# Patient Record
Sex: Male | Born: 1985 | Race: Black or African American | Hispanic: No | Marital: Married | Smoking: Current every day smoker
Health system: Southern US, Community
[De-identification: ages and names within clinical notes are randomized; demographics above are authoritative.]

---
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
## 2003-08-02 ENCOUNTER — Emergency Department (HOSPITAL_COMMUNITY): Admission: EM | Admit: 2003-08-02 | Discharge: 2003-08-02 | Payer: Self-pay | Admitting: Emergency Medicine

## 2003-08-15 ENCOUNTER — Emergency Department (HOSPITAL_COMMUNITY): Admission: EM | Admit: 2003-08-15 | Discharge: 2003-08-15 | Payer: Self-pay | Admitting: Emergency Medicine

## 2003-09-12 ENCOUNTER — Emergency Department (HOSPITAL_COMMUNITY): Admission: EM | Admit: 2003-09-12 | Discharge: 2003-09-12 | Payer: Self-pay | Admitting: Emergency Medicine

## 2005-10-28 ENCOUNTER — Emergency Department (HOSPITAL_COMMUNITY): Admission: EM | Admit: 2005-10-28 | Discharge: 2005-10-28 | Payer: Self-pay | Admitting: Emergency Medicine

## 2005-12-13 ENCOUNTER — Emergency Department (HOSPITAL_COMMUNITY): Admission: EM | Admit: 2005-12-13 | Discharge: 2005-12-13 | Payer: Self-pay | Admitting: Emergency Medicine

## 2005-12-15 ENCOUNTER — Emergency Department (HOSPITAL_COMMUNITY): Admission: EM | Admit: 2005-12-15 | Discharge: 2005-12-15 | Payer: Self-pay | Admitting: Emergency Medicine

## 2005-12-17 ENCOUNTER — Emergency Department (HOSPITAL_COMMUNITY): Admission: EM | Admit: 2005-12-17 | Discharge: 2005-12-17 | Payer: Self-pay | Admitting: Emergency Medicine

## 2006-01-15 ENCOUNTER — Emergency Department (HOSPITAL_COMMUNITY): Admission: EM | Admit: 2006-01-15 | Discharge: 2006-01-15 | Payer: Self-pay | Admitting: Emergency Medicine

## 2006-10-21 ENCOUNTER — Emergency Department (HOSPITAL_COMMUNITY): Admission: EM | Admit: 2006-10-21 | Discharge: 2006-10-21 | Payer: Self-pay | Admitting: Family Medicine

## 2007-01-09 ENCOUNTER — Emergency Department (HOSPITAL_COMMUNITY): Admission: EM | Admit: 2007-01-09 | Discharge: 2007-01-09 | Payer: Self-pay | Admitting: Emergency Medicine

## 2007-09-30 ENCOUNTER — Emergency Department (HOSPITAL_COMMUNITY): Admission: EM | Admit: 2007-09-30 | Discharge: 2007-09-30 | Payer: Self-pay | Admitting: Emergency Medicine

## 2011-03-25 LAB — WOUND CULTURE

## 2012-02-06 ENCOUNTER — Emergency Department (HOSPITAL_COMMUNITY)
Admission: EM | Admit: 2012-02-06 | Discharge: 2012-02-06 | Disposition: A | Discharge status: Home / Self Care | Payer: Self-pay | Attending: Emergency Medicine | Admitting: Emergency Medicine

## 2012-02-06 ENCOUNTER — Emergency Department (HOSPITAL_COMMUNITY): Payer: Self-pay

## 2012-02-06 ENCOUNTER — Encounter (HOSPITAL_COMMUNITY): Payer: Self-pay | Admitting: Adult Health

## 2012-02-06 DIAGNOSIS — F17200 Nicotine dependence, unspecified, uncomplicated: Secondary | ICD-10-CM | POA: Insufficient documentation

## 2012-02-06 DIAGNOSIS — S61219A Laceration without foreign body of unspecified finger without damage to nail, initial encounter: Secondary | ICD-10-CM

## 2012-02-06 DIAGNOSIS — S61209A Unspecified open wound of unspecified finger without damage to nail, initial encounter: Secondary | ICD-10-CM | POA: Insufficient documentation

## 2012-02-06 DIAGNOSIS — Y998 Other external cause status: Secondary | ICD-10-CM | POA: Insufficient documentation

## 2012-02-06 DIAGNOSIS — W268XXA Contact with other sharp object(s), not elsewhere classified, initial encounter: Secondary | ICD-10-CM | POA: Insufficient documentation

## 2012-02-06 DIAGNOSIS — Y9389 Activity, other specified: Secondary | ICD-10-CM | POA: Insufficient documentation

## 2012-02-06 MED ORDER — HYDROCODONE-ACETAMINOPHEN 5-325 MG PO TABS
1.0000 | ORAL_TABLET | Freq: Once | ORAL | Status: AC
  Administered 2012-02-06: 1 via ORAL
  Filled 2012-02-06: qty 1

## 2012-02-06 MED ORDER — HYDROCODONE-ACETAMINOPHEN 5-325 MG PO TABS: 1.0000 | ORAL_TABLET | ORAL | Status: AC | PRN

## 2012-02-06 NOTE — ED Notes (Signed)
Pt advised of the wait x3

## 2012-02-06 NOTE — ED Notes (Signed)
Reports left first, middle and ring finger lacerations from a lawn mower. CMS intact. Lacerations are jagged and approx 1/2 inch on middle finger. Bleeding controlled.  Lawn mower blades were not on, he was working on [employer], [employer].

## 2012-02-06 NOTE — ED Notes (Signed)
The pt advised of wait time

## 2012-02-06 NOTE — ED Provider Notes (Signed)
History     CSN: [id number]  Arrival date & time 02/06/12  1602   First MD Initiated Contact with Patient 02/06/12 2005      Chief Complaint  Patient presents with  . Laceration   HPI  History provided by patient and friend. Patient is a 26 year old male with no significant PMH who presents with lacerations to left fingers. Patient states he was attempting to fix a lawnmower when the blade came around and struck the back of his left fingers. He has lacerations to the second, third and fourth fingers. He complains of pain and some tingling to the fingers. She also feels fingers are becoming to get swollen and stiff. He denies any other injury. Symptoms were associated bleeding that was controlled. Patient is not take any blood thinner medications. Patient had a tetanus shot 4 years ago for different hand injury.   History reviewed. No pertinent past medical history.  History reviewed. No pertinent past surgical history.  History reviewed. No pertinent family history.  History  Substance Use Topics  . Smoking status: Current Everyday Smoker  . Smokeless tobacco: Not on file  . Alcohol Use: Yes      Review of Systems  Neurological: Negative for weakness and numbness.    Allergies  Review of patient's allergies indicates no known allergies.  Home Medications  No current outpatient prescriptions on file.  BP 129/88  Pulse 54  Temp 97.8 F (36.6 C) (Oral)  Resp 16  SpO2 100%  Physical Exam  Nursing note and vitals reviewed. Constitutional: He is oriented to person, place, and time. He appears well-developed and well-nourished. No distress.  HENT:  Head: Normocephalic.  Cardiovascular: Normal rate and regular rhythm.   Pulmonary/Chest: Effort normal and breath sounds normal.  Musculoskeletal:       Irregular jagged lacerations to the dorsal aspect of left third and fourth fingers. Small superficial abrasion to the dorsal left second finger. Laceration of the fourth  finger does enter the nail bed. Normal cap refill in all fingers. Normal range of motion and strength. Patient does have sensation to light touch distal aspects of all fingers.  Neurological: He is alert and oriented to person, place, and time.  Skin: Skin is warm.  Psychiatric: He has a normal mood and affect. His behavior is normal.    ED Course  Procedures (including critical care time)  LACERATION REPAIR Performed by: Angus Seller Authorized by: Angus Seller Consent: Verbal consent obtained. Risks and benefits: risks, benefits and alternatives were discussed Consent given by: patient Patient identity confirmed: provided demographic data Prepped and Draped in normal sterile fashion Wound explored  Laceration Location: Left 3rd finger  Laceration Length:2.5 cm  No Foreign Bodies seen or palpated  Anesthesia: Flexor tendon block   Local anesthetic: lidocaine 2% without epinephrine  Anesthetic total: 3 ml  Irrigation method: syringe Amount of cleaning: standard  Skin closure: Skin with 4-0 Prolene   Number of sutures: 4   Technique: Simple interrupted   Patient tolerance: Patient tolerated the procedure well with no immediate complications.    LACERATION REPAIR Performed by: Angus Seller Authorized by: Angus Seller Consent: Verbal consent obtained. Risks and benefits: risks, benefits and alternatives were discussed Consent given by: patient Patient identity confirmed: provided demographic data Prepped and Draped in normal sterile fashion Wound explored  Laceration Location: Left fourth finger  Laceration Length: 1.5 cm  No Foreign Bodies seen or palpated  Anesthesia: Flexor tendon block   Local anesthetic: lidocaine 2% without  epinephrine  Anesthetic total: 3 ml  Irrigation method: syringe Amount of cleaning: standard  Skin closure: Skin with 4-0 Prolene   Number of sutures: 2   Technique: Simple interrupted  Patient tolerance:  Patient tolerated the procedure well with no immediate complications.     Dg Hand Complete Left  02/06/2012  *RADIOLOGY REPORT*  Clinical Data: Laceration.  LEFT HAND - COMPLETE 3+ VIEW  Comparison: None.  Findings: Skin defects of the dorsal soft tissues of the long and ring fingers consistent with lacerations noted.  No radiopaque foreign body, fracture or dislocation.  IMPRESSION: Lacerations.  Otherwise negative.   Original Report Authenticated By: Bernadene Bell. D'ALESSIO, M.D.      1. Laceration of fingers without complication       MDM  8:20 PM patient seen and evaluated. Patient no acute distress. Patient with small lacerations over dorsal aspect of fingers of left hand. Full range of motion. Normal cap refill.   Patient current on tetanus. Lacerations were repaired. X-rays unremarkable. Patient was referred to orthopedic hand specialist Dr. Lajoyce Corners.        Angus Seller, [place] 02/07/12 [phone]

## 2012-02-07 NOTE — ED Provider Notes (Signed)
Medical screening examination/treatment/procedure(s) were performed by non-physician practitioner and as supervising physician I was immediately available for consultation/collaboration.  John-Adam Layani Foronda, M.D.     John-Adam Jomarie Gellis, MD 02/07/12 0041 

## 2012-04-02 ENCOUNTER — Emergency Department (HOSPITAL_COMMUNITY)
Admission: EM | Admit: 2012-04-02 | Discharge: 2012-04-02 | Disposition: A | Discharge status: Home / Self Care | Payer: No Typology Code available for payment source | Attending: Emergency Medicine | Admitting: Emergency Medicine

## 2012-04-02 ENCOUNTER — Encounter (HOSPITAL_COMMUNITY): Payer: Self-pay | Admitting: Emergency Medicine

## 2012-04-02 ENCOUNTER — Emergency Department (HOSPITAL_COMMUNITY): Payer: No Typology Code available for payment source

## 2012-04-02 DIAGNOSIS — R296 Repeated falls: Secondary | ICD-10-CM | POA: Insufficient documentation

## 2012-04-02 DIAGNOSIS — S40011A Contusion of right shoulder, initial encounter: Secondary | ICD-10-CM

## 2012-04-02 DIAGNOSIS — F17200 Nicotine dependence, unspecified, uncomplicated: Secondary | ICD-10-CM | POA: Insufficient documentation

## 2012-04-02 DIAGNOSIS — IMO0002 Reserved for concepts with insufficient information to code with codable children: Secondary | ICD-10-CM | POA: Insufficient documentation

## 2012-04-02 DIAGNOSIS — S60312A Abrasion of left thumb, initial encounter: Secondary | ICD-10-CM

## 2012-04-02 DIAGNOSIS — Y929 Unspecified place or not applicable: Secondary | ICD-10-CM | POA: Insufficient documentation

## 2012-04-02 DIAGNOSIS — Y9389 Activity, other specified: Secondary | ICD-10-CM | POA: Insufficient documentation

## 2012-04-02 DIAGNOSIS — S40019A Contusion of unspecified shoulder, initial encounter: Secondary | ICD-10-CM | POA: Insufficient documentation

## 2012-04-02 MED ORDER — IBUPROFEN 800 MG PO TABS: 800.0000 mg | ORAL_TABLET | Freq: Three times a day (TID) | ORAL | Status: DC | PRN

## 2012-04-02 MED ORDER — IBUPROFEN 800 MG PO TABS
800.0000 mg | ORAL_TABLET | Freq: Once | ORAL | Status: AC
  Administered 2012-04-02: 800 mg via ORAL
  Filled 2012-04-02: qty 1
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
## 2012-04-02 NOTE — ED Notes (Signed)
Pt discharged.Vital signs stable and GCS 15 

## 2012-04-02 NOTE — ED Notes (Signed)
Pt presented to ED with rt shoulder injury.
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
## 2012-06-22 ENCOUNTER — Encounter (HOSPITAL_COMMUNITY): Payer: Self-pay | Admitting: Emergency Medicine

## 2012-06-22 ENCOUNTER — Encounter (HOSPITAL_COMMUNITY)
Admission: EM | Disposition: A | Discharge status: Home / Self Care | Payer: Self-pay | Source: Home / Self Care | Attending: Emergency Medicine

## 2012-06-22 ENCOUNTER — Encounter (HOSPITAL_COMMUNITY): Payer: Self-pay | Admitting: Anesthesiology

## 2012-06-22 ENCOUNTER — Observation Stay (HOSPITAL_COMMUNITY): Payer: Self-pay | Admitting: Anesthesiology

## 2012-06-22 ENCOUNTER — Observation Stay (HOSPITAL_COMMUNITY)
Admission: EM | Admit: 2012-06-22 | Discharge: 2012-06-23 | Disposition: A | Discharge status: Home / Self Care | Payer: Self-pay | Attending: Otolaryngology | Admitting: Otolaryngology

## 2012-06-22 DIAGNOSIS — Z23 Encounter for immunization: Secondary | ICD-10-CM | POA: Insufficient documentation

## 2012-06-22 DIAGNOSIS — R55 Syncope and collapse: Secondary | ICD-10-CM | POA: Insufficient documentation

## 2012-06-22 DIAGNOSIS — J36 Peritonsillar abscess: Secondary | ICD-10-CM | POA: Insufficient documentation

## 2012-06-22 DIAGNOSIS — J3503 Chronic tonsillitis and adenoiditis: Principal | ICD-10-CM | POA: Insufficient documentation

## 2012-06-22 DIAGNOSIS — J020 Streptococcal pharyngitis: Secondary | ICD-10-CM | POA: Insufficient documentation

## 2012-06-22 HISTORY — PX: TONSILLECTOMY AND ADENOIDECTOMY: SHX28

## 2012-06-22 LAB — GRAM STAIN

## 2012-06-22 LAB — CBC
HCT: 39.6 % (ref 39.0–52.0)
Hemoglobin: 13.2 g/dL (ref 13.0–17.0)
MCH: 28.9 pg (ref 26.0–34.0)
MCV: 86.7 fL (ref 78.0–100.0)
RBC: 4.57 MIL/uL (ref 4.22–5.81)

## 2012-06-22 LAB — BASIC METABOLIC PANEL
CO2: 26 mEq/L (ref 19–32)
Calcium: 8.7 mg/dL (ref 8.4–10.5)
Chloride: 103 mEq/L (ref 96–112)
Glucose, Bld: 118 mg/dL — ABNORMAL HIGH (ref 70–99)
Potassium: 3.5 mEq/L (ref 3.5–5.1)
Sodium: 139 mEq/L (ref 135–145)

## 2012-06-22 LAB — APTT: aPTT: 30 seconds (ref 24–37)

## 2012-06-22 SURGERY — TONSILLECTOMY AND ADENOIDECTOMY
Anesthesia: General | Site: Mouth | Laterality: Bilateral | Wound class: Dirty or Infected

## 2012-06-22 MED ORDER — ONDANSETRON HCL 4 MG/2ML IJ SOLN
INTRAMUSCULAR | Status: DC | PRN
  Administered 2012-06-22: 4 mg via INTRAVENOUS

## 2012-06-22 MED ORDER — MIDAZOLAM HCL 5 MG/5ML IJ SOLN
INTRAMUSCULAR | Status: DC | PRN
  Administered 2012-06-22: 2 mg via INTRAVENOUS

## 2012-06-22 MED ORDER — SODIUM CHLORIDE 0.9 % IV SOLN
3.0000 g | Freq: Once | INTRAVENOUS | Status: AC
  Administered 2012-06-22: 3 g via INTRAVENOUS
  Filled 2012-06-22: qty 3

## 2012-06-22 MED ORDER — PHENOL 1.4 % MT LIQD
2.0000 | Freq: Three times a day (TID) | OROMUCOSAL | Status: DC | PRN
  Filled 2012-06-22: qty 177

## 2012-06-22 MED ORDER — ONDANSETRON HCL 4 MG/2ML IJ SOLN: 4.0000 mg | Freq: Once | INTRAMUSCULAR | Status: DC | PRN

## 2012-06-22 MED ORDER — HYDROCODONE-ACETAMINOPHEN 7.5-500 MG/15ML PO SOLN
15.0000 mL | ORAL | Status: DC | PRN
  Administered 2012-06-22 (×4): 15 mL via ORAL
  Filled 2012-06-22 (×4): qty 15

## 2012-06-22 MED ORDER — 0.9 % SODIUM CHLORIDE (POUR BTL) OPTIME
TOPICAL | Status: DC | PRN
  Administered 2012-06-22: 1000 mL

## 2012-06-22 MED ORDER — METHYLPREDNISOLONE SODIUM SUCC 125 MG IJ SOLR
125.0000 mg | Freq: Once | INTRAMUSCULAR | Status: AC
  Administered 2012-06-22: 125 mg via INTRAVENOUS
  Filled 2012-06-22: qty 2

## 2012-06-22 MED ORDER — MORPHINE SULFATE 2 MG/ML IJ SOLN: 1.0000 mg | INTRAMUSCULAR | Status: DC | PRN

## 2012-06-22 MED ORDER — MORPHINE SULFATE 4 MG/ML IJ SOLN
4.0000 mg | Freq: Once | INTRAMUSCULAR | Status: AC
  Administered 2012-06-22: 4 mg via INTRAVENOUS
  Filled 2012-06-22: qty 1

## 2012-06-22 MED ORDER — ACETAMINOPHEN 10 MG/ML IV SOLN
1000.0000 mg | Freq: Once | INTRAVENOUS | Status: AC | PRN
  Administered 2012-06-22: 1000 mg via INTRAVENOUS

## 2012-06-22 MED ORDER — INFLUENZA VIRUS VACC SPLIT PF IM SUSP
0.5000 mL | INTRAMUSCULAR | Status: DC
  Filled 2012-06-22: qty 0.5

## 2012-06-22 MED ORDER — DIPHENHYDRAMINE HCL 50 MG/ML IJ SOLN
12.5000 mg | Freq: Four times a day (QID) | INTRAMUSCULAR | Status: DC | PRN
  Administered 2012-06-22: 25 mg via INTRAVENOUS
  Filled 2012-06-22: qty 1

## 2012-06-22 MED ORDER — SODIUM CHLORIDE 0.9 % IV SOLN
3.0000 g | Freq: Four times a day (QID) | INTRAVENOUS | Status: DC
  Administered 2012-06-22 – 2012-06-23 (×5): 3 g via INTRAVENOUS
  Filled 2012-06-22 (×7): qty 3

## 2012-06-22 MED ORDER — LIDOCAINE HCL (CARDIAC) 20 MG/ML IV SOLN
INTRAVENOUS | Status: DC | PRN
  Administered 2012-06-22: 50 mg via INTRAVENOUS

## 2012-06-22 MED ORDER — SUCCINYLCHOLINE CHLORIDE 20 MG/ML IJ SOLN
INTRAMUSCULAR | Status: DC | PRN
  Administered 2012-06-22: 100 mg via INTRAVENOUS

## 2012-06-22 MED ORDER — SODIUM CHLORIDE 0.9 % IV BOLUS (SEPSIS)
1000.0000 mL | Freq: Once | INTRAVENOUS | Status: AC
  Administered 2012-06-22: 1000 mL via INTRAVENOUS

## 2012-06-22 MED ORDER — ONDANSETRON HCL 4 MG/2ML IJ SOLN: 4.0000 mg | Freq: Four times a day (QID) | INTRAMUSCULAR | Status: DC | PRN

## 2012-06-22 MED ORDER — HYDROMORPHONE HCL PF 1 MG/ML IJ SOLN
INTRAMUSCULAR | Status: AC
  Filled 2012-06-22: qty 1

## 2012-06-22 MED ORDER — FENTANYL CITRATE 0.05 MG/ML IJ SOLN
INTRAMUSCULAR | Status: DC | PRN
  Administered 2012-06-22 (×2): 50 ug via INTRAVENOUS
  Administered 2012-06-22: 150 ug via INTRAVENOUS

## 2012-06-22 MED ORDER — DIPHENHYDRAMINE HCL 12.5 MG/5ML PO ELIX
12.5000 mg | ORAL_SOLUTION | Freq: Four times a day (QID) | ORAL | Status: DC | PRN
  Filled 2012-06-22: qty 10

## 2012-06-22 MED ORDER — LACTATED RINGERS IV SOLN
INTRAVENOUS | Status: DC | PRN
  Administered 2012-06-22 (×2): via INTRAVENOUS

## 2012-06-22 MED ORDER — OXYMETAZOLINE HCL 0.05 % NA SOLN
NASAL | Status: DC | PRN
  Administered 2012-06-22: 1

## 2012-06-22 MED ORDER — AMPICILLIN-SULBACTAM SODIUM 3 (2-1) G IJ SOLR
3.0000 g | Freq: Four times a day (QID) | INTRAMUSCULAR | Status: DC
  Filled 2012-06-22: qty 3

## 2012-06-22 MED ORDER — ACETAMINOPHEN 160 MG/5ML PO SOLN: 325.0000 mg | ORAL | Status: DC | PRN

## 2012-06-22 MED ORDER — PROPOFOL 10 MG/ML IV BOLUS
INTRAVENOUS | Status: DC | PRN
  Administered 2012-06-22: 200 mg via INTRAVENOUS

## 2012-06-22 MED ORDER — ACETAMINOPHEN 10 MG/ML IV SOLN
INTRAVENOUS | Status: AC
  Filled 2012-06-22: qty 100

## 2012-06-22 MED ORDER — ONDANSETRON HCL 4 MG/2ML IJ SOLN
INTRAMUSCULAR | Status: AC
  Administered 2012-06-22: 4 mg
  Filled 2012-06-22: qty 2

## 2012-06-22 MED ORDER — DEXTROSE-NACL 5-0.9 % IV SOLN
INTRAVENOUS | Status: DC
  Administered 2012-06-22 (×2): via INTRAVENOUS

## 2012-06-22 MED ORDER — HYDROMORPHONE HCL PF 1 MG/ML IJ SOLN
0.2500 mg | INTRAMUSCULAR | Status: DC | PRN
  Administered 2012-06-22 (×4): 0.5 mg via INTRAVENOUS

## 2012-06-22 MED ORDER — DEXAMETHASONE SODIUM PHOSPHATE 10 MG/ML IJ SOLN
10.0000 mg | Freq: Three times a day (TID) | INTRAMUSCULAR | Status: AC
  Administered 2012-06-22 – 2012-06-23 (×3): 10 mg via INTRAVENOUS
  Filled 2012-06-22 (×3): qty 1

## 2012-06-22 SURGICAL SUPPLY — 36 items
CANISTER SUCTION 2500CC (MISCELLANEOUS) ×2 IMPLANT
CATH ROBINSON RED A/P 10FR (CATHETERS) ×2 IMPLANT
CLEANER TIP ELECTROSURG 2X2 (MISCELLANEOUS) ×2 IMPLANT
CLOTH BEACON ORANGE TIMEOUT ST (SAFETY) ×2 IMPLANT
COAGULATOR SUCT SWTCH 10FR 6 (ELECTROSURGICAL) ×2 IMPLANT
ELECT COATED BLADE 2.86 ST (ELECTRODE) ×2 IMPLANT
ELECT REM PT RETURN 9FT ADLT (ELECTROSURGICAL) ×2
ELECT REM PT RETURN 9FT PED (ELECTROSURGICAL)
ELECTRODE REM PT RETRN 9FT PED (ELECTROSURGICAL) IMPLANT
ELECTRODE REM PT RTRN 9FT ADLT (ELECTROSURGICAL) IMPLANT
GAUZE SPONGE 4X4 16PLY XRAY LF (GAUZE/BANDAGES/DRESSINGS) ×2 IMPLANT
GLOVE BIO SURGEON STRL SZ8 (GLOVE) ×1 IMPLANT
GLOVE BIOGEL PI IND STRL 8 (GLOVE) IMPLANT
GLOVE BIOGEL PI INDICATOR 8 (GLOVE) ×1
GLOVE SURG SS PI 7.5 STRL IVOR (GLOVE) ×2 IMPLANT
GOWN STRL NON-REIN LRG LVL3 (GOWN DISPOSABLE) ×2 IMPLANT
GOWN STRL REIN 3XL LVL4 (GOWN DISPOSABLE) ×1 IMPLANT
KIT BASIN OR (CUSTOM PROCEDURE TRAY) ×2 IMPLANT
KIT ROOM TURNOVER OR (KITS) ×2 IMPLANT
NDL 18GX1X1/2 (RX/OR ONLY) (NEEDLE) IMPLANT
NEEDLE 18GX1X1/2 (RX/OR ONLY) (NEEDLE) ×2 IMPLANT
NS IRRIG 1000ML POUR BTL (IV SOLUTION) ×2 IMPLANT
PACK SURGICAL SETUP 50X90 (CUSTOM PROCEDURE TRAY) ×2 IMPLANT
PAD ARMBOARD 7.5X6 YLW CONV (MISCELLANEOUS) ×4 IMPLANT
PENCIL BUTTON HOLSTER BLD 10FT (ELECTRODE) ×2 IMPLANT
SPECIMEN JAR SMALL (MISCELLANEOUS) ×2 IMPLANT
SPONGE TONSIL 1 RF SGL (DISPOSABLE) IMPLANT
SWAB COLLECTION DEVICE MRSA (MISCELLANEOUS) ×1 IMPLANT
SYR 5ML LUER SLIP (SYRINGE) ×1 IMPLANT
SYR BULB 3OZ (MISCELLANEOUS) ×2 IMPLANT
TOWEL OR 17X24 6PK STRL BLUE (TOWEL DISPOSABLE) ×3 IMPLANT
TUBE ANAEROBIC SPECIMEN COL (MISCELLANEOUS) ×1 IMPLANT
TUBE CONNECTING 12X1/4 (SUCTIONS) ×2 IMPLANT
TUBE SALEM SUMP 12R W/ARV (TUBING) IMPLANT
WATER STERILE IRR 1000ML POUR (IV SOLUTION) IMPLANT
YANKAUER SUCT BULB TIP NO VENT (SUCTIONS) ×2 IMPLANT

## 2012-06-22 NOTE — ED Provider Notes (Addendum)
Medical screening examination/treatment/procedure(s) were conducted as a shared visit with non-physician practitioner(s) and myself.  I personally evaluated the patient during the encounter Jones Skene, M.D.  Seen as Shared visit w/ PA  Paul Sherman is a 27 y.o. male presents with 3 days worsening sore throat and gradual worsening of jab pain.  Today he's had trouble swallowing his secretions and he says it's getting difficult to breathe.  Pain is severe, worse on swallowing, sharp and stabbing.  Subjective fevers, sore throat, jaw pain, difficulty opening mouth, throat swelling.  Patient denies changes in vision, neck pain or stiffness, chest pain or pressure, palpitations, syncope, dyspnea, cough, wheezing,  abdominal pain, nausea, vomiting, diarrhea, melena, red bloody stools, frequency, dysuria, myalgias, arthralgias, back pain, recent trauma, rash, itching, skin lesions, easy bruising or bleeding, headache, seizures, numbness.  VITAL SIGNS:   Filed Vitals:   06/23/12 0605  BP: 129/82  Pulse: 66  Temp: 98.2 F (36.8 C)  Resp: 18   CONSTITUTIONAL: Awake, oriented, appears non-toxic HENT: Atraumatic, normocephalic, oral mucosa pink and moist, airway patent - swelling is getting severe in soft palate and right tonsillar region.  Right tonsils erythematous with exudate >> left side. Trismus - pt can open jaw 2.5cm with severe pain. Nares patent without drainage. External ears normal. EYES: Conjunctiva clear, EOMI, PERRLA NECK: Trachea midline, non-tender, supple CARDIOVASCULAR: Normal heart rate, Normal rhythm, No murmurs, rubs, gallops PULMONARY/CHEST: Clear to auscultation, no rhonchi, wheezes, or rales. Symmetrical breath sounds. Non-tender. ABDOMINAL: Non-distended, soft, non-tender - no rebound or guarding.  BS normal. NEUROLOGIC: Non-focal, moving all four extremities, no gross sensory or motor deficits. EXTREMITIES: No clubbing, cyanosis, or edema SKIN: Warm, Dry, No  erythema, No rash  ZOX:WRUEAVW [name] is a 27 y.o. male presents with PTA. Discussed with Dr. Emeline Darling for evaluation who saw the pt in the ED and will take to OR. Pt syncopized 2x w/ Dr. Emeline Darling exam likely secondary to pain.  PT stable on transfer to PACU    Jones Skene, MD 06/23/12 [phone]

## 2012-06-22 NOTE — Anesthesia Preprocedure Evaluation (Addendum)
Anesthesia Evaluation  Patient identified by MRN, date of birth, ID band Patient awake    Reviewed: Allergy & Precautions, H&P , NPO status , Patient's Chart, lab work & pertinent test results, reviewed documented beta blocker date and time   Airway Mallampati: III    Mouth opening: Limited Mouth Opening  Dental  (+) Teeth Intact and Dental Advisory Given   Pulmonary neg pulmonary ROS, Current Smoker,  breath sounds clear to auscultation        Cardiovascular negative cardio ROS  Rhythm:Regular Rate:Normal     Neuro/Psych negative neurological ROS  negative psych ROS   GI/Hepatic negative GI ROS, Neg liver ROS,   Endo/Other  negative endocrine ROS  Renal/GU negative Renal ROS     Musculoskeletal negative musculoskeletal ROS (+)   Abdominal   Peds  Hematology negative hematology ROS (+)   Anesthesia Other Findings   Reproductive/Obstetrics negative OB ROS                          Anesthesia Physical Anesthesia Plan  ASA: II and emergent  Anesthesia Plan: General   Post-op Pain Management:    Induction: Intravenous  Airway Management Planned: Oral ETT  Additional Equipment:   Intra-op Plan:   Post-operative Plan: Extubation in OR  Informed Consent: I have reviewed the patients History and Physical, chart, labs and discussed the procedure including the risks, benefits and alternatives for the proposed anesthesia with the patient or authorized representative who has indicated his/her understanding and acceptance.   Dental advisory given  Plan Discussed with: CRNA and Anesthesiologist  Anesthesia Plan Comments: (R.Peritonsillar Abscess Smoker  Plan GA with RSI  Kipp Brood, MD)       Anesthesia Quick Evaluation

## 2012-06-22 NOTE — Progress Notes (Signed)
Subjective: POD#0 from adenotonsillectomy for right peritonsillar abscess and adenotonsillar hypertrophy with streptococcal adenotonsillitis.  Objective: Vital signs in last 24 hours: Temp:  [97.7 F (36.5 C)-99.2 F (37.3 C)] 97.7 F (36.5 C) (01/13 0506) Pulse Rate:  [60-106] 74  (01/13 0525) Resp:  [12-18] 16  (01/13 0525) BP: (102-167)/(59-109) 153/87 mmHg (01/13 0525) SpO2:  [98 %-100 %] 100 % (01/13 0506)  Awake, alert, class I occlusion, tonsils surgically absent with oral cavity hemostatic. No stridor or stertor, strong voice.  @LABLAST2 (wbc:2,hgb:2,hct:2,plt:2)  Basename 06/22/12 0238  NA 139  K 3.5  CL 103  CO2 26  GLUCOSE 118*  BUN 7  CREATININE 1.37*  CALCIUM 8.7    Medications:  Scheduled Meds:   . acetaminophen      . [MAR HOLD] ampicillin-sulbactam (UNASYN) IV  3 g Intravenous Q6H  . [MAR HOLD] dexamethasone  10 mg Intravenous Q8H  . HYDROmorphone      . HYDROmorphone       Continuous Infusions:   . dextrose 5 % and 0.9% NaCl 75 mL/hr at 06/22/12 0301   PRN Meds:.[facility] HOLD] acetaminophen (TYLENOL) oral liquid 160 mg/5 mL, [MAR HOLD] diphenhydrAMINE, [MAR HOLD] diphenhydrAMINE, [MAR HOLD] HYDROcodone-acetaminophen, HYDROmorphone (DILAUDID) injection, [MAR HOLD]  morphine injection, [MAR HOLD] ondansetron (ZOFRAN) IV, ondansetron (ZOFRAN) IV  Assessment/Plan: Doing well s/p adenotonsillectomy. Advance diet as tolerated, Decadron and Unasyn IV. Will discharge when pain controlled and taking good PO intake.   LOS: 0 days   Melvenia Beam 06/22/2012, 5:33 AM

## 2012-06-22 NOTE — Transfer of Care (Signed)
Immediate Anesthesia Transfer of Care Note  Patient: Paul Sherman  Procedure(s) Performed: Procedure(s) (LRB) with comments: TONSILLECTOMY AND ADENOIDECTOMY (Bilateral)  Patient Location: PACU  Anesthesia Type:General  Level of Consciousness: awake, alert  and oriented  Airway & Oxygen Therapy: Patient Spontanous Breathing and Patient connected to nasal cannula oxygen  Post-op Assessment: Report given to PACU RN and Post -op Vital signs reviewed and stable  Post vital signs: Reviewed and stable  Complications: No apparent anesthesia complications

## 2012-06-22 NOTE — ED Provider Notes (Signed)
History     CSN: [id number]  Arrival date & time 06/22/12  0044   First MD Initiated Contact with Patient 06/22/12 0103      Chief Complaint  Patient presents with  . Sore Throat    (Consider location/radiation/quality/duration/timing/severity/associated sxs/prior treatment) HPI  Pt presents to the ED with complaints of pain in right ear and right side of throat for the past 3 days. He says the pain has gotten so bad that he could not longer stand it. He says that he can hardly open his mouth due to pain. He denies having fevers, N/V/D, weakness, AMS, or neck pain. vss  History reviewed. No pertinent past medical history.  History reviewed. No pertinent past surgical history.  No family history on file.  History  Substance Use Topics  . Smoking status: Current Every Day Smoker  . Smokeless tobacco: Not on file  . Alcohol Use: Yes      Review of Systems  All other systems reviewed and are negative.       Allergies  Review of patient's allergies indicates no known allergies.  Home Medications   Current Outpatient Rx  Name  Route  Sig  Dispense  Refill  . OVER THE COUNTER MEDICATION      Vicks OTC cold medication           BP 136/86  Pulse 106  Temp 99.2 F (37.3 C) (Oral)  Resp 18  SpO2 98%  Physical Exam  Nursing note and vitals reviewed. Constitutional: He appears well-developed and well-nourished. No distress.  HENT:  Head: Normocephalic and atraumatic. There is trismus in the jaw.  Mouth/Throat: Mucous membranes are normal. No oral lesions. No dental abscesses. Tonsillar abscesses present. No oropharyngeal exudate, posterior oropharyngeal edema or posterior oropharyngeal erythema.       Mildly deviated to the left  Eyes: Pupils are equal, round, and reactive to light.  Neck: Normal range of motion. Neck supple.  Cardiovascular: Normal rate and regular rhythm.   Pulmonary/Chest: Effort normal.  Abdominal: Soft.  Neurological: He is alert.    Skin: Skin is warm and dry.    ED Course  Procedures (including critical care time)  Labs Reviewed  RAPID STREP SCREEN - Abnormal; Notable for the following:    Streptococcus, Group A Screen (Direct) POSITIVE (*)     All other components within normal limits  BASIC METABOLIC PANEL  CBC  PROTIME-INR  APTT   No results found.   No diagnosis found. Dx: peritonsillar abscess   MDM  Concern for abscess. Discussed case with Dr. Rulon Abide. ENT will be consulted.  Morphine, solumedrol, and normal saline ordered. Zosyn and Zofran as well.  Dr. Emeline Darling has seen patient and is going to take him to surgery.         Dorthula Matas, PA 06/22/12 [phone]

## 2012-06-22 NOTE — ED Notes (Signed)
Pt transported to OR. Report to RN

## 2012-06-22 NOTE — H&P (Signed)
06/22/2012 2:20 AM  Paul Sherman  PREOPERATIVE HISTORY AND PHYSICAL  CHIEF COMPLAINT: right peritonsillar abscess  HISTORY: This is a 27 year old who presents with right peritonsillar abscess. He now presents for bilateral tonsillectomy and possible adenoidectomy.  Dr. Emeline Darling, [place] Riley has discussed the risks (bleeding, infection, risks of general anesthesia), benefits, and alternatives of this procedure. The patient understands the risks and would like to proceed with the procedure. The chances of success of the procedure are >50% and the patient understands this. I personally performed an examination of the patient within 24 hours of the procedure.  PAST MEDICAL HISTORY: History reviewed. No pertinent past medical history.  PAST SURGICAL HISTORY: History reviewed. No pertinent past surgical history.  MEDICATIONS: No current facility-administered medications on file prior to encounter.   No current outpatient prescriptions on file prior to encounter.    ALLERGIES: No Known Allergies  SOCIAL HISTORY: History   Social History  . Marital Status: Married    Spouse Name: N/A    Number of Children: N/A  . Years of Education: N/A   Occupational History  . Not on file.   Social History Main Topics  . Smoking status: Current Every Day Smoker  . Smokeless tobacco: Not on file  . Alcohol Use: Yes  . Drug Use: No  . Sexually Active:    Other Topics Concern  . Not on file   Social History Narrative  . No narrative on file    FAMILY HISTORY:No family history on file.  REVIEW OF SYSTEMS:  HEENT: right tonsillar swelling, poor PO intake, sore throat x 7 days, otherwise negative x 10 systems except per HPI   PHYSICAL EXAM:  GENERAL:  NAD VITAL SIGNS:   Filed Vitals:   06/22/12 0049  BP:   Pulse:   Temp: 99.2 F (37.3 C)  Resp:    SKIN:  Warm, dry HEENT:  + trismus, right anterior tonsillar edema and erythema, 3+ tonsils. patient had vasovagal reaction 3 times  during oral cavity exam NECK:  supple LYMPH:  Scattered nodes palpable LUNGS:  Grossly clear CARDIOVASCULAR:  RRR ABDOMEN:  Soft, NT MUSCULOSKELETAL: normal strength PSYCH:  Normal affect NEUROLOGIC:  CN 2-12 intact and symmetric   ASSESSMENT AND PLAN: Plan to proceed with tonsillectomy and possible adenoidectomy for right peritonsillar abscess since patient cannot tolerate oral cavity exam or bedside drainage. Patient understands the risks, benefits, and alternatives. Informed written consent on chart. 06/22/2012 2:20 AM Paul Sherman

## 2012-06-22 NOTE — Op Note (Signed)
DATE OF OPERATION: 06/22/2012 5:07 AM Surgeon: Melvenia Beam Procedure Performed: 16109 bilateral tonsillectomy with adenoidectomy  PREOPERATIVE DIAGNOSIS: adenotonsillar hypertrophy, acute streptococcal adenotonsillitis, right peritonsillar abscess POSTOPERATIVE DIAGNOSIS: adenotonsillar hypertrophy, acute streptococcal adenotonsillitis, right peritonsillar abscess SURGEON: Melvenia Beam ANESTHESIA: General endotracheal.  ESTIMATED BLOOD LOSS: less than 5 mL.  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 The bed was turned 90 degrees and the Crowe-Davis mouth retractor was placed over the endotracheal tube and suspended from the Mayo stand. The palate was inspected and palpated and noted to be intact with no submucous cleft, but the soft palate and uvula were significantly edematous and the uvula was deviated to the left.  The adenoids were inspected with a dental mirror and noted to be hypertrophic. The adenoids were removed and ablated with the Bovie suction cautery. The adenoid pad was packed for 5 minutes after which the packs were removed and meticulous hemostasis was obtained on the adenoid pad using the suction Bovie.  Next the right tonsil was grasped with a curved Allis clamp and dissected from the right tonsillar fossa using the Bovie. In the right midpole, posterolateral to the right tonsil there was a large peritonsillar abscess that was opened and drained, cultured, and irrigated out, and then removal of the right tonsil with the Bovie was completed. Meticulous hemostasis was then achieved. The left  tonsil was then grasped with the curved Allis and dissected from the left tonsillar fossa using the Bovie. Meticulous hemostasis was achieved. The nasal cavity and oropharynx were irrigated out and then the the nose, oral cavity,  and stomach were suctioned out. The patient was turned back to anesthesia and awakened from anesthesia and extubated without difficulty. The patient tolerated the procedure well with no immediate complications and was taken to the postoperative recovery area in good condition.   Dr. Melvenia Beam was present and performed the entire procedure. 06/22/2012 5:07 AM Melvenia Beam

## 2012-06-22 NOTE — ED Notes (Signed)
Pt st's he had pain in right ear 1 week ago now has sore throat and pain in his right jaw when he tries to open his mouth

## 2012-06-22 NOTE — ED Notes (Addendum)
Pt with tonsillar abscess per EDP and request pt be moved to another room in dept for further tx.

## 2012-06-22 NOTE — Anesthesia Postprocedure Evaluation (Signed)
  Anesthesia Post-op Note  Patient: Paul Sherman  Procedure(s) Performed: Procedure(s) (LRB) with comments: TONSILLECTOMY AND ADENOIDECTOMY (Bilateral)  Patient Location: PACU  Anesthesia Type:General  Level of Consciousness: awake, alert  and oriented  Airway and Oxygen Therapy: Patient Spontanous Breathing  Post-op Pain: mild  Post-op Assessment: Post-op Vital signs reviewed, Patient's Cardiovascular Status Stable, Respiratory Function Stable, Patent Airway, No signs of Nausea or vomiting and Pain level controlled  Post-op Vital Signs: stable  Complications: No apparent anesthesia complications

## 2012-06-22 NOTE — Preoperative (Signed)
Beta Blockers   Reason not to administer Beta Blockers:Not Applicable. No home beta bloockers

## 2012-06-22 NOTE — ED Notes (Signed)
ENT at bedside, attempting to looking into pt mouth when  pt experience a Sudden vagal responses,lasted less than 20 seconds. pt was started sweating, suddenly wake up at  Asked " did I just past out" . VSS. Pt will be going to OR for tonsillotomy. Marland Kitchen

## 2012-06-23 ENCOUNTER — Encounter (HOSPITAL_COMMUNITY): Payer: Self-pay | Admitting: Otolaryngology

## 2012-06-23 NOTE — Discharge Summary (Signed)
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  Labs:reviewed, + rapid strep  Procedure(s) performed:06/22/12 adenotonsillectomy >29 years old  Discharge Condition:improved  Discharge Exam:Cn 2-12 intact and symmetric, oral cavity hemostatic with tonsils surgically absent  Discharge Instructions: Follow up with Dr. Emeline Darling in 2-3 weeks. Post-tonsillectomy/full liquid diet as tolerated, up ad lib. No strenuous activity x 2 weeks. Your prescriptions are on your chart along with a work excuse.  Hospital Course:admitted 06/22/12 for right peritonsillar abscess, did not tolerate bedside exam due to vasovagal episodes so taken to OR for adenotonsillectomy and drainage of right peritonsillar abscess. Did well post-op, pain well controlled and taking good PO so discharged on POD#1 in good condition.  Melvenia Beam 9:13 AM 06/23/2012

## 2012-06-23 NOTE — Progress Notes (Signed)
DC HOME WITH FRIEND, VERBALLY UNDERSTOOD DC INSTRUCTIONS, NO QUESTIONS ASKED 

## 2012-06-27 LAB — ANAEROBIC CULTURE

## 2012-06-29 LAB — CULTURE, ROUTINE-ABSCESS: Gram Stain: NONE SEEN

## 2013-03-25 ENCOUNTER — Ambulatory Visit: Payer: Self-pay

## 2013-03-26 ENCOUNTER — Ambulatory Visit: Payer: No Typology Code available for payment source | Attending: Internal Medicine | Admitting: Internal Medicine

## 2013-03-26 VITALS — BP 152/76 | HR 65 | Temp 98.5°F | Resp 16 | Wt 270.0 lb

## 2013-03-26 DIAGNOSIS — F17200 Nicotine dependence, unspecified, uncomplicated: Secondary | ICD-10-CM

## 2013-03-26 DIAGNOSIS — H538 Other visual disturbances: Secondary | ICD-10-CM | POA: Insufficient documentation

## 2013-03-26 DIAGNOSIS — Z0000 Encounter for general adult medical examination without abnormal findings: Secondary | ICD-10-CM | POA: Insufficient documentation

## 2013-03-26 DIAGNOSIS — R030 Elevated blood-pressure reading, without diagnosis of hypertension: Secondary | ICD-10-CM | POA: Insufficient documentation

## 2013-03-26 DIAGNOSIS — R51 Headache: Secondary | ICD-10-CM | POA: Insufficient documentation

## 2013-03-26 DIAGNOSIS — Z23 Encounter for immunization: Secondary | ICD-10-CM

## 2013-03-26 DIAGNOSIS — Z720 Tobacco use: Secondary | ICD-10-CM

## 2013-03-26 MED ORDER — NICOTINE 14 MG/24HR TD PT24: 1.0000 | MEDICATED_PATCH | TRANSDERMAL | Status: DC

## 2013-03-26 NOTE — Patient Instructions (Signed)
DASH Diet  The DASH diet stands for "Dietary Approaches to Stop Hypertension." It is a healthy eating plan that has been shown to reduce high blood pressure (hypertension) in as little as 14 days, while also possibly providing other significant health benefits. These other health benefits include reducing the risk of breast cancer after menopause and reducing the risk of type 2 diabetes, heart disease, colon cancer, and stroke. Health benefits also include weight loss and slowing kidney failure in patients with chronic kidney disease.   DIET GUIDELINES  · Limit salt (sodium). Your diet should contain less than 1500 mg of sodium daily.  · Limit refined or processed carbohydrates. Your diet should include mostly whole grains. Desserts and added sugars should be used sparingly.  · Include small amounts of heart-healthy fats. These types of fats include nuts, oils, and tub margarine. Limit saturated and trans fats. These fats have been shown to be harmful in the body.  CHOOSING FOODS   The following food groups are based on a 2000 calorie diet. See your Registered Dietitian for individual calorie needs.  Grains and Grain Products (6 to 8 servings daily)  · Eat More Often: Whole-wheat bread, brown rice, whole-grain or wheat pasta, quinoa, popcorn without added fat or salt (air popped).  · Eat Less Often: White bread, white pasta, white rice, cornbread.  Vegetables (4 to 5 servings daily)  · Eat More Often: Fresh, frozen, and canned vegetables. Vegetables may be raw, steamed, roasted, or grilled with a minimal amount of fat.  · Eat Less Often/Avoid: Creamed or fried vegetables. Vegetables in a cheese sauce.  Fruit (4 to 5 servings daily)  · Eat More Often: All fresh, canned (in natural juice), or frozen fruits. Dried fruits without added sugar. One hundred percent fruit juice (½ cup [237 mL] daily).  · Eat Less Often: Dried fruits with added sugar. Canned fruit in light or heavy syrup.  Lean Meats, Fish, and Poultry (2  servings or less daily. One serving is 3 to 4 oz [85-114 g]).  · Eat More Often: Ninety percent or leaner ground beef, tenderloin, sirloin. Round cuts of beef, chicken breast, turkey breast. All fish. Grill, bake, or broil your meat. Nothing should be fried.  · Eat Less Often/Avoid: Fatty cuts of meat, turkey, or chicken leg, thigh, or wing. Fried cuts of meat or fish.  Dairy (2 to 3 servings)  · Eat More Often: Low-fat or fat-free milk, low-fat plain or light yogurt, reduced-fat or part-skim cheese.  · Eat Less Often/Avoid: Milk (whole, 2%). Whole milk yogurt. Full-fat cheeses.  Nuts, Seeds, and Legumes (4 to 5 servings per week)  · Eat More Often: All without added salt.  · Eat Less Often/Avoid: Salted nuts and seeds, canned beans with added salt.  Fats and Sweets (limited)  · Eat More Often: Vegetable oils, tub margarines without trans fats, sugar-free gelatin. Mayonnaise and salad dressings.  · Eat Less Often/Avoid: Coconut oils, palm oils, butter, stick margarine, cream, half and half, cookies, candy, pie.  FOR MORE INFORMATION  The Dash Diet Eating Plan: www.dashdiet.org  Document Released: 05/16/2011 Document Revised: 08/19/2011 Document Reviewed: 05/16/2011  ExitCare® Patient Information ©2014 [employer].

## 2013-03-26 NOTE — Progress Notes (Signed)
Patient ID: Paul Sherman, male   DOB: 1986/01/24, 27 y.o.   MRN: [id number]   PCP:  Jeanann Lewandowsky, MD    Chief Complaint:  Routine physical  HPI: 27 year old male here for a routine physical. The patient reports having off-and-on headaches with some blurred vision trying to read far objects. He denies any dizziness, tinnitus, nausea, vomiting, chest pain, palpitations, shortness of breath, abdominal pain, dysuria, hematuria, bowel symptoms. Denies any change in his weight or appetite. Patient cooks at home but his diet includes a lot of salt intake. He also smokes half a pack of cigarettes a day.  Allergies: No Known Allergies  Prior to Admission medications   Not on File    History reviewed. No pertinent past medical history.  Past Surgical History  Procedure Laterality Date  . Tonsillectomy and adenoidectomy  06/22/2012    Procedure: TONSILLECTOMY AND ADENOIDECTOMY;  Surgeon: Melvenia Beam, MD;  Location: [facility] OR;  Service: ENT;  Laterality: Bilateral;    Social History:  reports that he has been smoking.  He does not have any smokeless tobacco history on file. He reports that he drinks alcohol. He reports that he does not use illicit drugs.  Family History  Problem Relation Age of Onset  . Cancer Mother   . Cancer Sister     Review of Systems:  Constitutional: Denies fever, chills, diaphoresis, appetite change and fatigue.  HEENT: Denies photophobia, eye pain, redness, hearing loss, ear pain, congestion, sore throat, rhinorrhea, sneezing, mouth sores, trouble swallowing, neck pain, neck stiffness and tinnitus.   Respiratory: Denies SOB, DOE, cough, chest tightness,  and wheezing.   Cardiovascular: Denies chest pain, palpitations and leg swelling.  Gastrointestinal: Denies nausea, vomiting, abdominal pain, diarrhea, constipation, blood in stool and abdominal distention.  Genitourinary: Denies dysuria, urgency, frequency, hematuria, flank pain and difficulty urinating.   Endocrine: Denies polyuria, polydipsia. Musculoskeletal: Denies myalgias, back pain, joint swelling, arthralgias and gait problem.  Skin: Denies pallor, rash and wound.  Neurological: Denies dizziness, seizures, syncope, weakness, light-headedness, numbness Hematological: Denies adenopathy. Easy bruising, personal or family bleeding history    Physical Exam:  Filed Vitals:   03/26/13 0931  BP: 152/76  Pulse: 65  Temp: 98.5 F (36.9 C)  Resp: 16  Weight: 270 lb (122.471 kg)  SpO2: 100%    Constitutional: Vital signs reviewed.  Patient is a well-developed and well-nourished in no acute distress and cooperative with exam.  Head: Normocephalic and atraumatic Mouth:  MMM Eyes: PERRL, EOMI, conjunctivae normal, No scleral icterus.  Neck: Supple, no thyromegaly,  Cardiovascular: RRR, S1 normal, S2 normal, no MRG, pulses symmetric and intact bilaterally Pulmonary/Chest: CTAB, no wheezes, rales, or rhonchi Abdominal: Soft. Non-tender, non-distended, bowel sounds are normal, no masses, organomegaly, or guarding present.  Musculoskeletal: No joint deformities, erythema, or stiffness, ROM full and no nontender Ext: no edema and no cyanosis, Hematology: no cervical adenopathy.  Neurological: A&O x3, nonfocal  Labs on Admission:  No results found for this or any previous visit (from the past 48 hour(s)).  Radiological Exams on Admission: No results found.  Assessment/Plan Elevated blood pressure Patient reports increased salt intake in diet and smoking. Counseled on smoking cessation he did patient request for a nicotine patch which I will prescribe. Also provided him 1 800-number for quitting smoking. Provided resource for low salt diet Refer  him to ophthalmology for eye exam.  Flu vaccine ordered  Followup in 2 weeks for blood pressure monitoring  Hazen Brumett 03/26/2013, 9:47 AM

## 2013-03-26 NOTE — Progress Notes (Signed)
Patient here for physical and establish care Family history of breast cancer in his mom and sister Takes no prescribed medication

## 2013-03-29 ENCOUNTER — Other Ambulatory Visit: Payer: No Typology Code available for payment source

## 2013-03-30 ENCOUNTER — Ambulatory Visit: Payer: No Typology Code available for payment source | Attending: Internal Medicine | Admitting: Pharmacist

## 2013-03-30 DIAGNOSIS — Z0000 Encounter for general adult medical examination without abnormal findings: Secondary | ICD-10-CM

## 2013-04-01 ENCOUNTER — Ambulatory Visit: Payer: No Typology Code available for payment source | Attending: Internal Medicine | Admitting: Pharmacist

## 2013-04-01 DIAGNOSIS — Z0000 Encounter for general adult medical examination without abnormal findings: Secondary | ICD-10-CM

## 2013-04-13 ENCOUNTER — Ambulatory Visit: Payer: Self-pay | Admitting: Internal Medicine

## 2014-02-08 ENCOUNTER — Emergency Department (HOSPITAL_COMMUNITY): Payer: Self-pay

## 2014-02-08 ENCOUNTER — Emergency Department (HOSPITAL_COMMUNITY)
Admission: EM | Admit: 2014-02-08 | Discharge: 2014-02-08 | Disposition: A | Discharge status: Home / Self Care | Payer: Self-pay | Attending: Emergency Medicine | Admitting: Emergency Medicine

## 2014-02-08 ENCOUNTER — Encounter (HOSPITAL_COMMUNITY): Payer: Self-pay | Admitting: Emergency Medicine

## 2014-02-08 DIAGNOSIS — H1130 Conjunctival hemorrhage, unspecified eye: Secondary | ICD-10-CM | POA: Insufficient documentation

## 2014-02-08 DIAGNOSIS — IMO0002 Reserved for concepts with insufficient information to code with codable children: Secondary | ICD-10-CM | POA: Insufficient documentation

## 2014-02-08 DIAGNOSIS — S0591XA Unspecified injury of right eye and orbit, initial encounter: Secondary | ICD-10-CM

## 2014-02-08 DIAGNOSIS — Y9389 Activity, other specified: Secondary | ICD-10-CM | POA: Insufficient documentation

## 2014-02-08 DIAGNOSIS — Y9289 Other specified places as the place of occurrence of the external cause: Secondary | ICD-10-CM | POA: Insufficient documentation

## 2014-02-08 DIAGNOSIS — H1131 Conjunctival hemorrhage, right eye: Secondary | ICD-10-CM

## 2014-02-08 DIAGNOSIS — S0510XA Contusion of eyeball and orbital tissues, unspecified eye, initial encounter: Secondary | ICD-10-CM | POA: Insufficient documentation

## 2014-02-08 DIAGNOSIS — F17200 Nicotine dependence, unspecified, uncomplicated: Secondary | ICD-10-CM | POA: Insufficient documentation

## 2014-02-08 MED ORDER — FLUORESCEIN SODIUM 1 MG OP STRP
1.0000 | ORAL_STRIP | Freq: Once | OPHTHALMIC | Status: AC
  Administered 2014-02-08: 1 via OPHTHALMIC
  Filled 2014-02-08: qty 1

## 2014-02-08 MED ORDER — TETRACAINE HCL 0.5 % OP SOLN
1.0000 [drp] | Freq: Once | OPHTHALMIC | Status: AC
  Administered 2014-02-08: 1 [drp] via OPHTHALMIC
  Filled 2014-02-08: qty 2

## 2014-02-08 NOTE — ED Provider Notes (Signed)
CSN: [id number]     Arrival date & time 02/08/14  0031 History   First MD Initiated Contact with Patient 02/08/14 0045     Chief Complaint  Patient presents with  . Eye Pain      HPI Paul Sherman is a 28 y.o. male complaining of right eye pain, redness for 4 days that is getting worse. Patient was hit by a car door underneath his right eye. Patient denies LOC. Patient has swelling under right eye and conjunctival hemorrhages. Patient endorses sensation of foreign body. Patient denies visual changes, diplopia, decreased vision, blurred vision but endorses photophobia. Only watery discharge the first day after the injury. Patient denies Headache, weakness, slurred speech, N/V/D, abdominal pain, CP, SOB. Patient denies taking any medications for this or any prior work up. Patient denies chronic medical illnesses.   History reviewed. No pertinent past medical history. Past Surgical History  Procedure Laterality Date  . Tonsillectomy and adenoidectomy  06/22/2012    Procedure: TONSILLECTOMY AND ADENOIDECTOMY;  Surgeon: Melvenia Beam, MD;  Location: [facility] OR;  Service: ENT;  Laterality: Bilateral;   Family History  Problem Relation Age of Onset  . Cancer Mother   . Cancer Sister    History  Substance Use Topics  . Smoking status: Current Every Day Smoker  . Smokeless tobacco: Not on file  . Alcohol Use: Yes    Review of Systems  Constitutional: Negative for fever and chills.  HENT: Negative for congestion and rhinorrhea.   Eyes: Positive for photophobia, pain and redness. Negative for visual disturbance.  Respiratory: Negative for cough and shortness of breath.   Cardiovascular: Negative for chest pain and palpitations.  Gastrointestinal: Negative for nausea, vomiting and diarrhea.  Genitourinary: Negative for dysuria and hematuria.  Musculoskeletal: Negative for back pain and gait problem.  Skin: Negative for rash.  Neurological: Negative for weakness and headaches.       Allergies  Review of patient's allergies indicates no known allergies.  Home Medications   Prior to Admission medications   Not on File   BP 121/71  Pulse 77  Temp(Src) 98.1 F (36.7 C) (Oral)  Resp 16  Ht  (1.956 m)  Wt 210 lb (95.255 kg)  BMI 24.90 kg/m2  SpO2 97% Physical Exam  Nursing note and vitals reviewed. Constitutional: He is oriented to person, place, and time. He appears well-developed and well-nourished. No distress.  HENT:  Head: Normocephalic and atraumatic.  Mouth/Throat: Oropharynx is clear and moist.  Visual acuity: Near R 20/20; Near L 20/20 IOP: R 10; L 13   Eyes: EOM are normal. Pupils are equal, round, and reactive to light. Lids are everted and swept, no foreign bodies found. Right eye exhibits no discharge. Left eye exhibits no discharge. Right conjunctiva is injected. Right conjunctiva has a hemorrhage. Left conjunctiva is not injected. Left conjunctiva has no hemorrhage. No scleral icterus. Right eye exhibits normal extraocular motion and no nystagmus. Left eye exhibits normal extraocular motion and no nystagmus.  No seidel's sign. No corneal abrasion or ulcer. No dendritic lesion. No hyphema or hypopion.  Mild edema and erythema under right eye with 3 2-3cm linear healing lacerations that are clear, dry and intact.  Neck: Normal range of motion. Neck supple.  Cardiovascular: Normal rate, regular rhythm and normal heart sounds.   Pulmonary/Chest: Effort normal and breath sounds normal. No respiratory distress. He has no wheezes.  Abdominal: Soft. Bowel sounds are normal. He exhibits no distension. There is no tenderness.  Musculoskeletal:  Normal range of motion. He exhibits no tenderness.  Neurological: He is alert and oriented to person, place, and time. No cranial nerve deficit. He exhibits normal muscle tone. Coordination normal.  Strength 5/5 in upper and lower extremities. Sensation intact. Intact rapid alternating movements, finger to  nose, and heel to shin. Negative Romberg. Normal gait.   Skin: Skin is warm and dry. He is not diaphoretic.  Psychiatric: He has a normal mood and affect. His behavior is normal.    ED Course  Procedures (including critical care time) Labs Review Labs Reviewed - No data to display  Imaging Review Ct Maxillofacial Wo Cm  02/08/2014   CLINICAL DATA:  Trauma, Pain  EXAM: CT MAXILLOFACIAL WITHOUT CONTRAST  TECHNIQUE: Multidetector CT imaging of the maxillofacial structures was performed. Multiplanar CT image reconstructions were also generated. A small metallic BB was placed on the right temple in order to reliably differentiate right from left.  COMPARISON:  None.  FINDINGS: Small retention cysts or polyps in bilateral maxillary sinuses. Remainder of paranasal sinuses appear normally developed and well aerated. Nasal septum intact. Zygomatic arches intact. Temporomandibular joints seated. Mandible intact. Orbits and globes unremarkable.  IMPRESSION: 1. No acute abnormality. 2. Small bilateral maxillary sinus cysts or polyps.   Electronically Signed   By: Oley Balm M.D.   On: 02/08/2014 02:10     EKG Interpretation None     Meds given in ED:  Medications  tetracaine (PONTOCAINE) 0.5 % ophthalmic solution 1 drop (1 drop Both Eyes Given 02/08/14 0136)  fluorescein ophthalmic strip 1 strip (1 strip Both Eyes Given 02/08/14 0136)    There are no discharge medications for this patient.     MDM   Final diagnoses:  Right eye trauma, initial encounter  Subconjunctival hemorrhage of right eye   Paul Sherman is a 28 y.o. male complaining of right eye pain, redness without visual changes after trauma to the eye four days ago. VSS, patient in no acute distress. Patient with significant subconjunctival hemorrhages to right eye, photophobia and denies visual changes.  PERRL and EOMI. No corneal abrasion or lesion. Normal IOP. I suspect traumatic iritis. CT maxillofacial pending. If patient  without fracture, I recommend pain control and opthalmology referral.    Patient denies LOC, visual changes, slurred speech, Headaches, N/V/D or focal weakness. I doubt intracranial bleed or subarachnoid hemorrhage due patients completely benign neurological exam and no HA in ED or after incident.  Patient signed out to Earley Favor, PA-C at shift change. I suspect traumatic iritis. Plan is for CT maxillofacial. If patient without fx's, plan is for pain control and opthalmology referral/follow up.   Discussed all results and patient verbalizes understanding and agrees with plan.  This patient was discussed with the physician, Dr. Marisa Severin.       Louann Sjogren, PA-C 02/08/14 1215

## 2014-02-08 NOTE — ED Notes (Signed)
Rt. Eye pain: redness, states, "no visual problems - sensitive to light when starring at it." pt. Was trying to open the truck door when he hit himself in rt. Eye.

## 2014-02-08 NOTE — ED Provider Notes (Signed)
Ct Scan normal   Arman Filter, NP 02/08/14 [phone]

## 2014-02-08 NOTE — Discharge Instructions (Signed)
Your Ct Scan is normal

## 2014-02-08 NOTE — ED Notes (Signed)
Pt denies pain in right eye, states he can see clearly and is ready to go.

## 2014-02-08 NOTE — ED Provider Notes (Signed)
Medical screening examination/treatment/procedure(s) were performed by non-physician practitioner and as supervising physician I was immediately available for consultation/collaboration.   EKG Interpretation None       Vershawn Westrup M Armida Vickroy, MD 02/08/14 0417 

## 2014-02-09 NOTE — ED Provider Notes (Signed)
Medical screening examination/treatment/procedure(s) were performed by non-physician practitioner and as supervising physician I was immediately available for consultation/collaboration.   EKG Interpretation None       Marcelia Petersen M Terius Jacuinde, MD 02/09/14 0427 

## 2016-02-26 ENCOUNTER — Emergency Department (HOSPITAL_COMMUNITY): Admission: EM | Admit: 2016-02-26 | Discharge: 2016-02-26 | Discharge status: Against Medical Advice | Payer: Self-pay

## 2016-02-26 ENCOUNTER — Emergency Department (HOSPITAL_COMMUNITY)
Admission: EM | Admit: 2016-02-26 | Discharge: 2016-02-26 | Disposition: A | Discharge status: Home / Self Care | Payer: Self-pay | Attending: Emergency Medicine | Admitting: Emergency Medicine

## 2016-02-26 ENCOUNTER — Emergency Department (HOSPITAL_COMMUNITY): Payer: Self-pay

## 2016-02-26 DIAGNOSIS — R112 Nausea with vomiting, unspecified: Secondary | ICD-10-CM | POA: Insufficient documentation

## 2016-02-26 DIAGNOSIS — J029 Acute pharyngitis, unspecified: Secondary | ICD-10-CM | POA: Insufficient documentation

## 2016-02-26 DIAGNOSIS — Z7982 Long term (current) use of aspirin: Secondary | ICD-10-CM | POA: Insufficient documentation

## 2016-02-26 DIAGNOSIS — R05 Cough: Secondary | ICD-10-CM | POA: Insufficient documentation

## 2016-02-26 DIAGNOSIS — Z79899 Other long term (current) drug therapy: Secondary | ICD-10-CM | POA: Insufficient documentation

## 2016-02-26 DIAGNOSIS — R6889 Other general symptoms and signs: Secondary | ICD-10-CM

## 2016-02-26 DIAGNOSIS — F17200 Nicotine dependence, unspecified, uncomplicated: Secondary | ICD-10-CM | POA: Insufficient documentation

## 2016-02-26 DIAGNOSIS — R197 Diarrhea, unspecified: Secondary | ICD-10-CM | POA: Insufficient documentation

## 2016-02-26 LAB — COMPREHENSIVE METABOLIC PANEL
ALBUMIN: 4.1 g/dL (ref 3.5–5.0)
ALT: 27 U/L (ref 17–63)
AST: 26 U/L (ref 15–41)
Alkaline Phosphatase: 69 U/L (ref 38–126)
Anion gap: 7 (ref 5–15)
BILIRUBIN TOTAL: 0.3 mg/dL (ref 0.3–1.2)
BUN: 18 mg/dL (ref 6–20)
CHLORIDE: 105 mmol/L (ref 101–111)
CO2: 28 mmol/L (ref 22–32)
CREATININE: 1.53 mg/dL — AB (ref 0.61–1.24)
Calcium: 9.4 mg/dL (ref 8.9–10.3)
GFR calc Af Amer: >60 mL/min (ref >60)
GFR, EST NON AFRICAN AMERICAN: 59 mL/min — AB (ref >60)
GLUCOSE: 80 mg/dL (ref 65–99)
Potassium: 4.5 mmol/L (ref 3.5–5.1)
Sodium: 140 mmol/L (ref 135–145)
Total Protein: 7.4 g/dL (ref 6.5–8.1)

## 2016-02-26 LAB — CBC
HCT: 44.2 % (ref 39.0–52.0)
Hemoglobin: 14.6 g/dL (ref 13.0–17.0)
MCH: 29.9 pg (ref 26.0–34.0)
MCHC: 33 g/dL (ref 30.0–36.0)
MCV: 90.6 fL (ref 78.0–100.0)
PLATELETS: 197 10*3/uL (ref 150–400)
RBC: 4.88 MIL/uL (ref 4.22–5.81)
RDW: 13.9 % (ref 11.5–15.5)
WBC: 7.8 10*3/uL (ref 4.0–10.5)

## 2016-02-26 LAB — LIPASE, BLOOD: LIPASE: 40 U/L (ref 11–51)

## 2016-02-26 MED ORDER — ONDANSETRON HCL 4 MG/2ML IJ SOLN
4.0000 mg | Freq: Once | INTRAMUSCULAR | Status: AC
  Administered 2016-02-26: 4 mg via INTRAVENOUS
  Filled 2016-02-26: qty 2

## 2016-02-26 MED ORDER — DICYCLOMINE HCL 10 MG PO CAPS: 10.0000 mg | ORAL_CAPSULE | Freq: Three times a day (TID) | ORAL | 0 refills | Status: AC

## 2016-02-26 MED ORDER — SODIUM CHLORIDE 0.9 % IV BOLUS (SEPSIS)
1000.0000 mL | Freq: Once | INTRAVENOUS | Status: AC
  Administered 2016-02-26: 1000 mL via INTRAVENOUS

## 2016-02-26 MED ORDER — DIPHENOXYLATE-ATROPINE 2.5-0.025 MG PO TABS: 1.0000 | ORAL_TABLET | Freq: Four times a day (QID) | ORAL | 0 refills | Status: AC | PRN

## 2016-02-26 MED ORDER — BENZONATATE 100 MG PO CAPS: 100.0000 mg | ORAL_CAPSULE | Freq: Three times a day (TID) | ORAL | 0 refills | Status: AC

## 2016-02-26 MED ORDER — BENZONATATE 100 MG PO CAPS
100.0000 mg | ORAL_CAPSULE | Freq: Once | ORAL | Status: AC
  Administered 2016-02-26: 100 mg via ORAL
  Filled 2016-02-26: qty 1

## 2016-02-26 NOTE — ED Notes (Signed)
Patient called again x2 in main ED waiting area with no repsonse

## 2016-02-26 NOTE — ED Notes (Signed)
Patient was alert, oriented and stable upon discharge. RN went over AVS and patient had no further questions.  

## 2016-02-26 NOTE — ED Notes (Addendum)
Went to call patient back for triage no response x3

## 2016-02-26 NOTE — ED Triage Notes (Signed)
Pt states that x 2 days he has had N/V/D. Cough and generalized abdominal pain. Alert and oriented.

## 2016-02-26 NOTE — ED Provider Notes (Signed)
[facility]-EMERGENCY DEPT Provider Note   CSN: [id number] Arrival date & time: 02/26/16  1648  By signing my name below, I, Vista Mink, attest that this documentation has been prepared under the direction and in the presence of Sharnelle Cappelli PA-C.  Electronically Signed: Vista Mink, ED Scribe. 02/26/16. 9:11 PM.  History   Chief Complaint Chief Complaint  Patient presents with  . Emesis  . Diarrhea    HPI HPI Comments: Paul Sherman is a 30 y.o. male who presents to the Emergency Department complaining of nausea, vomiting, diarrhea and sore throat, onset five days ago. Five days ago pt started to have rhinorrhea, cough and a sore throat. Three days later he started to feel worse and started to have nausea, vomiting and multiple episodes of diarrhea. He also reports intermittent chills and "hot flashes" during the day. He reports 4 episodes of diarrhea since arriving to the ED. Pt denies any abdominal pain.    The history is provided by the patient. No language interpreter was used.    No past medical history on file.  Patient Active Problem List   Diagnosis Date Noted  . Routine general medical examination at a health care facility 03/26/2013    Past Surgical History:  Procedure Laterality Date  . TONSILLECTOMY AND ADENOIDECTOMY  06/22/2012   Procedure: TONSILLECTOMY AND ADENOIDECTOMY;  Surgeon: Melvenia Beam, MD;  Location: [facility] OR;  Service: ENT;  Laterality: Bilateral;       Home Medications    Prior to Admission medications   Medication Sig Start Date End Date Taking? Authorizing Provider  aspirin EC 325 MG tablet Take 975 mg by mouth daily as needed for mild pain.   Yes Historical Provider, MD  Multiple Vitamin (MULTIVITAMIN WITH MINERALS) TABS tablet Take 1 tablet by mouth daily.   Yes Historical Provider, MD  Phenyleph-Doxylamine-DM-APAP (NYQUIL SEVERE COLD/FLU PO) Take 2 capsules by mouth daily as needed (cold symptoms).   Yes Historical Provider, MD  benzonatate  (TESSALON) 100 MG capsule Take 1 capsule (100 mg total) by mouth every 8 (eight) hours. 02/26/16   Katana Berthold Neva Seat, PA-C  dicyclomine (BENTYL) 10 MG capsule Take 1 capsule (10 mg total) by mouth 4 (four) times daily -  before meals and at bedtime. 02/26/16   Taran Hable Neva Seat, PA-C  diphenoxylate-atropine (LOMOTIL) 2.5-0.025 MG tablet Take 1 tablet by mouth 4 (four) times daily as needed for diarrhea or loose stools. 02/26/16   Marlon Pel, PA-C    Family History Family History  Problem Relation Age of Onset  . Cancer Mother   . Cancer Sister     Social History Social History  Substance Use Topics  . Smoking status: Current Every Day Smoker  . Smokeless tobacco: Not on file  . Alcohol use Yes     Allergies   Review of patient's allergies indicates no known allergies.   Review of Systems Review of Systems  Constitutional: Positive for chills.  HENT: Positive for rhinorrhea and sore throat.   Gastrointestinal: Positive for diarrhea, nausea and vomiting. Negative for abdominal pain.  All other systems reviewed and are negative.    Physical Exam Updated Vital Signs BP 145/100 (BP Location: Left Arm)   Pulse 72   Temp 97.5 F (36.4 C) (Oral)   Resp 20   SpO2 100%   Physical Exam  Constitutional: He is oriented to person, place, and time. He appears well-developed and well-nourished.  HENT:  Head: Normocephalic and atraumatic.  Eyes: EOM are normal. Pupils are equal, round,  and reactive to light.  Neck: Normal range of motion.  Cardiovascular: Normal rate and regular rhythm.   Pulmonary/Chest: Effort normal and breath sounds normal.  Abdominal: Soft. Normal appearance. Bowel sounds are increased. There is no tenderness. There is no rebound, no guarding and negative Murphy's sign.  Musculoskeletal: Normal range of motion.  Neurological: He is alert and oriented to person, place, and time.  Skin: Skin is warm and dry.     ED Treatments / Results  DIAGNOSTIC  STUDIES: Oxygen Saturation is 100% on RA, normal by my interpretation.  COORDINATION OF CARE: 9:07 PM-Will order fluids and chest imaging. Discussed treatment plan with pt at bedside and pt agreed to plan.   Labs (all labs ordered are listed, but only abnormal results are displayed) Labs Reviewed  COMPREHENSIVE METABOLIC PANEL - Abnormal; Notable for the following:       Result Value   Creatinine, Ser 1.53 (*)    GFR calc non Af Amer 59 (*)    All other components within normal limits  LIPASE, BLOOD  CBC    EKG  EKG Interpretation None       Radiology Dg Chest 2 View  Result Date: 02/26/2016 CLINICAL DATA:  Nausea, vomiting, diarrhea, fever EXAM: CHEST  2 VIEW COMPARISON:  None. FINDINGS: The heart size and mediastinal contours are within normal limits. Both lungs are clear. The visualized skeletal structures are unremarkable. IMPRESSION: No active cardiopulmonary disease. Electronically Signed   By: Natasha Mead M.D.   On: 02/26/2016 21:44    Procedures Procedures (including critical care time)  Medications Ordered in ED Medications  sodium chloride 0.9 % bolus 1,000 mL (1,000 mLs Intravenous New Bag/Given 02/26/16 2123)  ondansetron (ZOFRAN) injection 4 mg (4 mg Intravenous Given 02/26/16 2123)  benzonatate (TESSALON) capsule 100 mg (100 mg Oral Given 02/26/16 2203)     Initial Impression / Assessment and Plan / ED Course  I have reviewed the triage vital signs and the nursing notes.  Pertinent labs & imaging results that were available during my care of the patient were reviewed by me and considered in my medical decision making (see chart for details).  Clinical Course    Pt does not appear to be clinically dehydrated but has an elevated  Creatinine, given a liter of fluids in the ED. He is young and very healthy appearing. rx for tessalon pearls, lomotil and Bentyl. rollow-up with PCP.  Patient with symptoms consistent with influenza.  Vitals are stable.  tolerating PO's.  Lungs are clear. Due to patient's presentation and physical exam a chest x-ray was not ordered bc likely diagnosis of flu.  Patient will be discharged with instructions to orally hydrate, rest, and use over-the-counter medications such as anti-inflammatories ibuprofen and Aleve for muscle aches and Tylenol for fever.  Patient will also be given a cough suppressant.   Vitals:   02/26/16 1707 02/26/16 2142  BP: 134/91 145/100  Pulse: 86 72  Resp: 18 20  Temp: 98.2 F (36.8 C) 97.5 F (36.4 C)      Final Clinical Impressions(s) / ED Diagnoses   Final diagnoses:  Flu-like symptoms    New Prescriptions New Prescriptions   BENZONATATE (TESSALON) 100 MG CAPSULE    Take 1 capsule (100 mg total) by mouth every 8 (eight) hours.   DICYCLOMINE (BENTYL) 10 MG CAPSULE    Take 1 capsule (10 mg total) by mouth 4 (four) times daily -  before meals and at bedtime.   DIPHENOXYLATE-ATROPINE (LOMOTIL) 2.5-0.025  MG TABLET    Take 1 tablet by mouth 4 (four) times daily as needed for diarrhea or loose stools.  I personally performed the services described in this documentation, which was scribed in my presence. The recorded information has been reviewed and is accurate.     Marlon Peliffany Madox Corkins, PA-C 02/26/16 2226    Mancel BaleElliott Wentz, MD 02/27/16 [id number]

## 2016-02-26 NOTE — ED Triage Notes (Signed)
Pt called x 3 for triage. No response.

## 2016-02-26 NOTE — ED Notes (Signed)
Patient called in main ED waiting area with no response 

## 2016-08-03 IMAGING — CT CT MAXILLOFACIAL W/O CM
3 of 4 series · 16 of 47 positions shown, 19 images · non-contrast
Comparison: None.

CLINICAL DATA: Trauma, Pain

EXAM:
CT MAXILLOFACIAL WITHOUT CONTRAST
TECHNIQUE: Multidetector CT imaging of the maxillofacial structures was
performed. Multiplanar CT image reconstructions were also generated.
A small metallic BB was placed on the right temple in order to
reliably differentiate right from left.

[Series 2: facial/ orbits 2.0 h30s · axial · 0.32mm/px · z∈[-233,-55]mm · 10 of 103 slices shown, 13 images]
[im 7/103  brain]
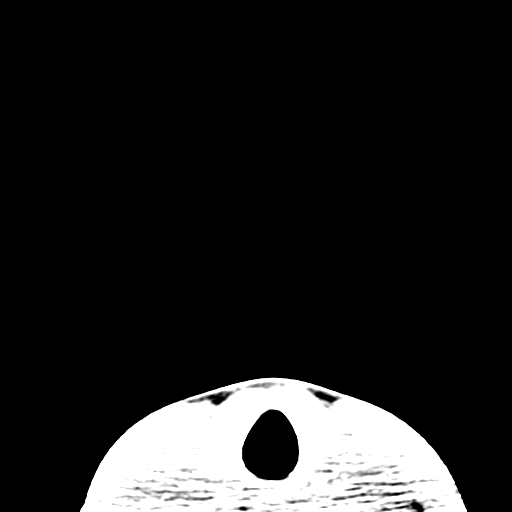
[im 7/103  bone]
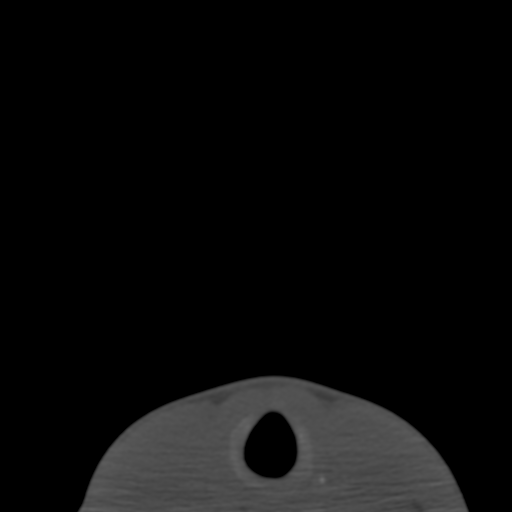
[im 21/103  bone]
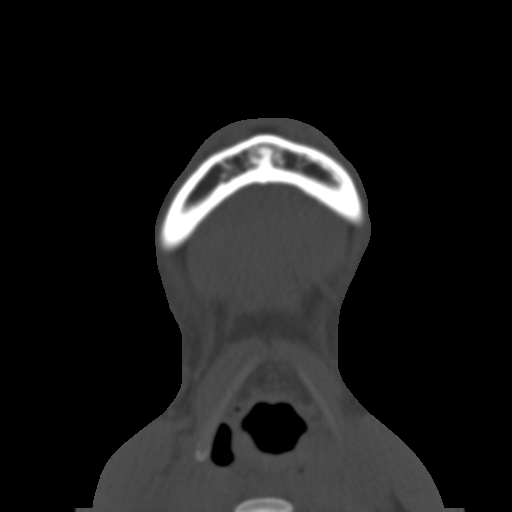
[im 28/103  bone]
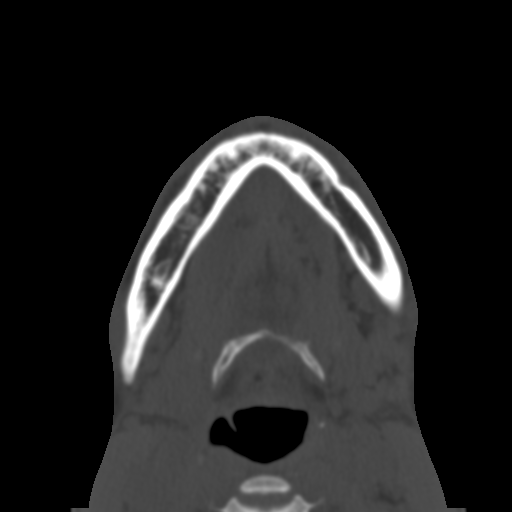
[im 35/103  bone]
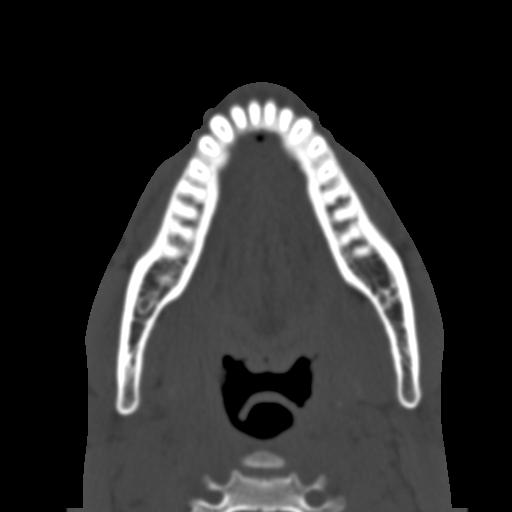
[im 48/103  brain]
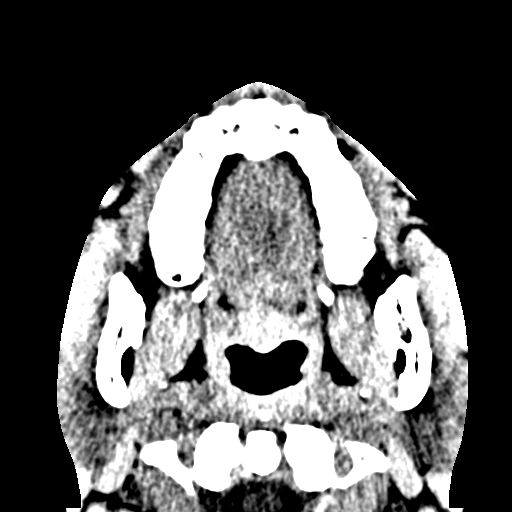
[im 48/103  bone]
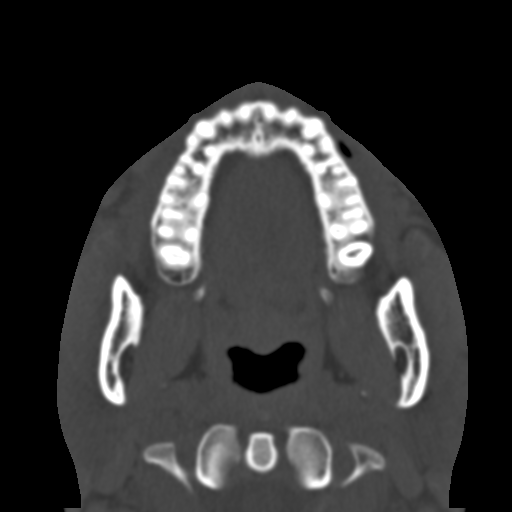
[im 55/103  bone]
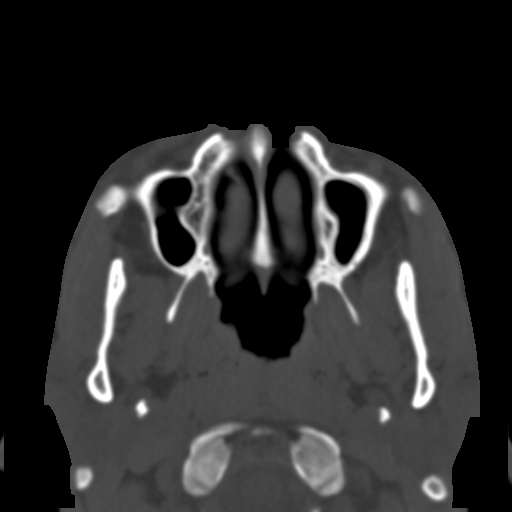
[im 69/103  bone]
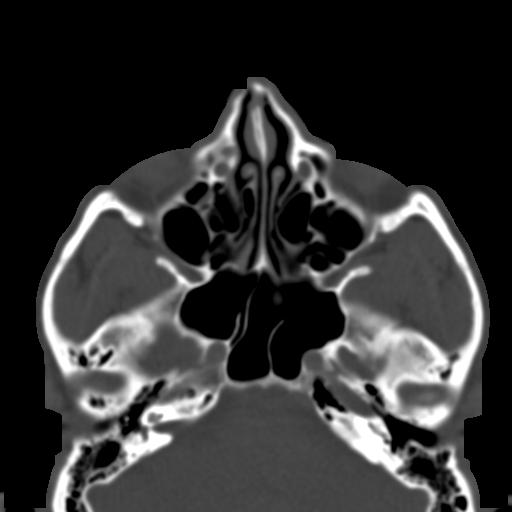
[im 75/103  bone]
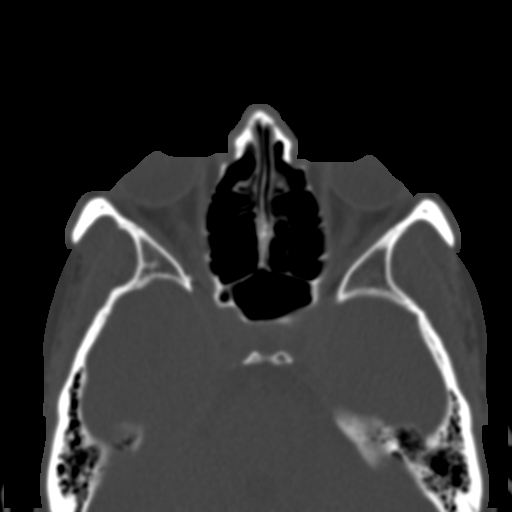
[im 82/103  brain]
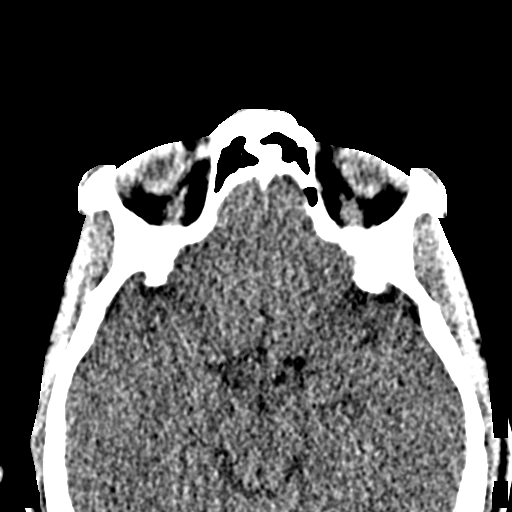
[im 82/103  bone]
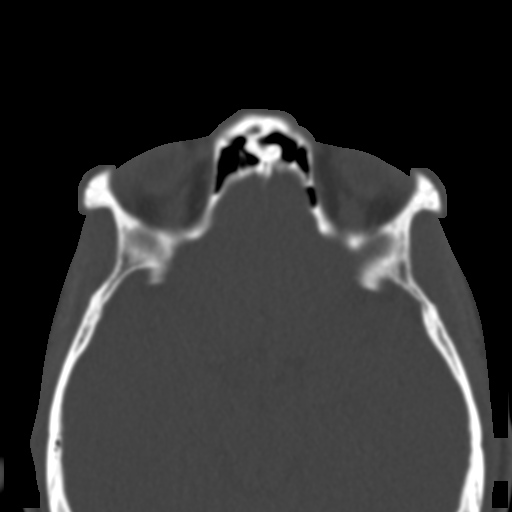
[im 96/103  bone]
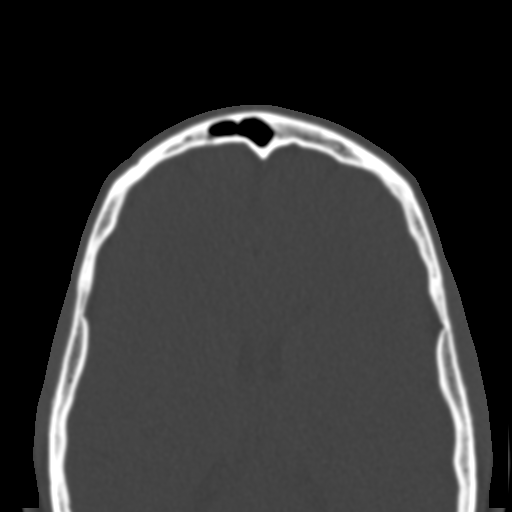

[Series 4: coronal st · coronal · 0.33mm/px · 3 of 65 slices shown]
[im 22/65  bone]
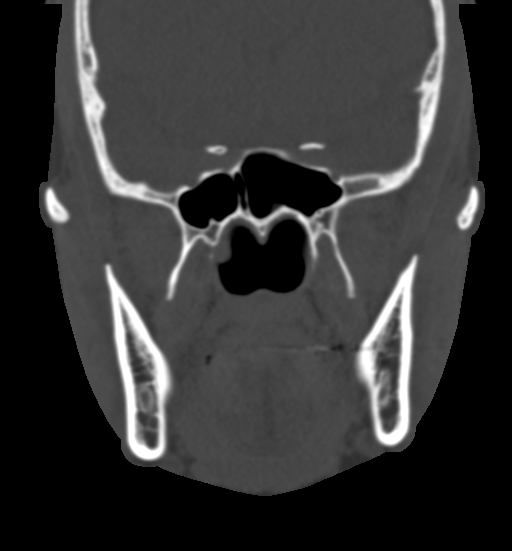
[im 29/65  bone]
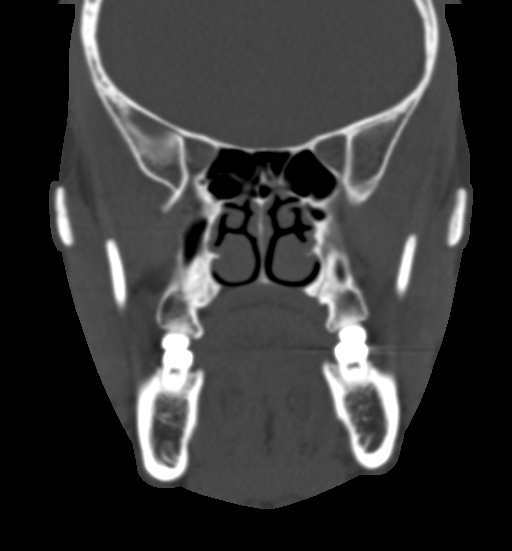
[im 36/65  bone]
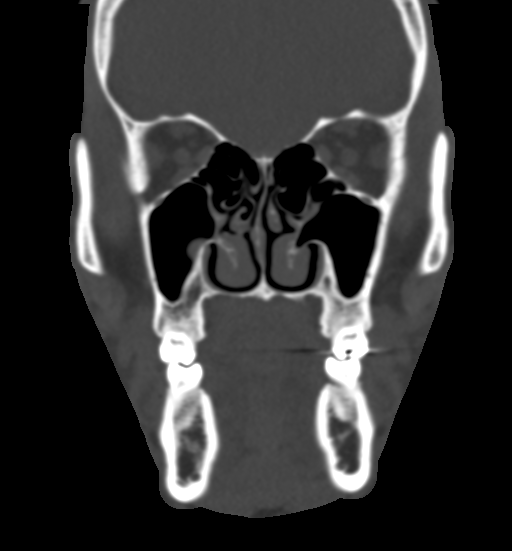

[Series 5: sagittal st · sagittal · 0.34mm/px · 3 of 75 slices shown]
[im 25/75  bone]
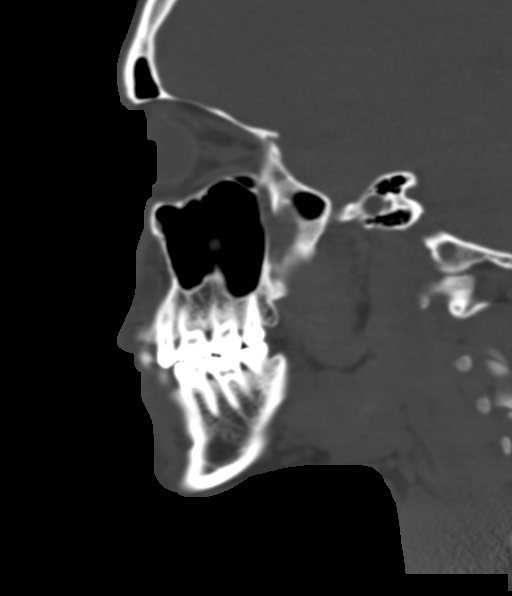
[im 38/75  bone]
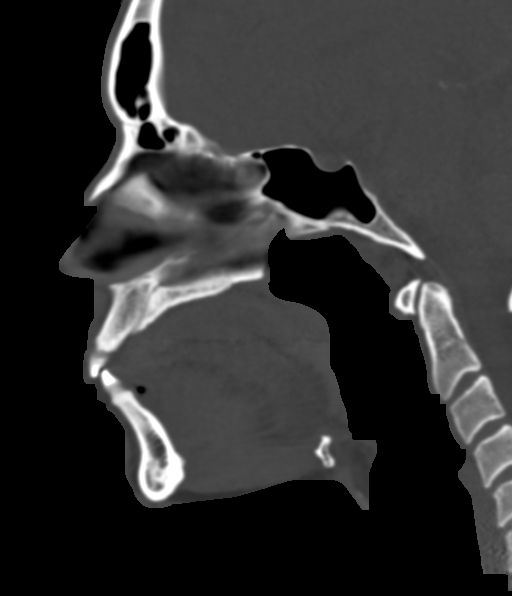
[im 50/75  bone]
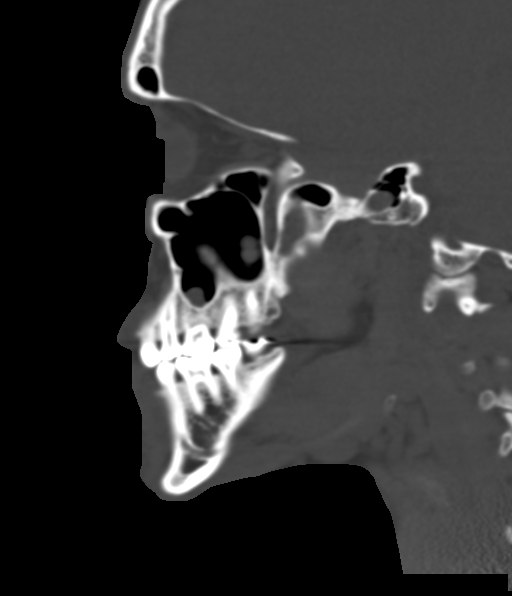

[16 of 47 positions shown; findings below may reference images not displayed]

FINDINGS: Small retention cysts or polyps in bilateral maxillary sinuses.
Remainder of paranasal sinuses appear normally developed and well
aerated. Nasal septum intact. Zygomatic arches intact.
Temporomandibular joints seated. Mandible intact. Orbits and globes
unremarkable.
IMPRESSION: 1. No acute abnormality.
2. Small bilateral maxillary sinus cysts or polyps.

## 2017-02-23 ENCOUNTER — Encounter (HOSPITAL_COMMUNITY): Payer: Self-pay | Admitting: *Deleted

## 2017-02-23 ENCOUNTER — Emergency Department (HOSPITAL_COMMUNITY)
Admission: EM | Admit: 2017-02-23 | Discharge: 2017-02-23 | Disposition: A | Discharge status: Home / Self Care | Payer: Self-pay | Attending: Emergency Medicine | Admitting: Emergency Medicine

## 2017-02-23 DIAGNOSIS — F17200 Nicotine dependence, unspecified, uncomplicated: Secondary | ICD-10-CM | POA: Insufficient documentation

## 2017-02-23 DIAGNOSIS — Z79899 Other long term (current) drug therapy: Secondary | ICD-10-CM | POA: Insufficient documentation

## 2017-02-23 DIAGNOSIS — K644 Residual hemorrhoidal skin tags: Secondary | ICD-10-CM | POA: Insufficient documentation

## 2017-02-23 MED ORDER — POLYETHYLENE GLYCOL 3350 17 GM/SCOOP PO POWD: 17.0000 g | Freq: Two times a day (BID) | ORAL | 0 refills | Status: AC

## 2017-02-23 MED ORDER — HYDROCORTISONE 2.5 % RE CREA: TOPICAL_CREAM | RECTAL | 0 refills | Status: AC

## 2017-02-23 MED ORDER — DOCUSATE SODIUM 100 MG PO CAPS: 100.0000 mg | ORAL_CAPSULE | Freq: Two times a day (BID) | ORAL | 0 refills | Status: AC

## 2017-02-23 NOTE — ED Notes (Signed)
Declined W/C at D/C and was escorted to lobby by RN. 

## 2017-02-23 NOTE — ED Triage Notes (Signed)
Pt reports rectal pain since yesterday morning. States he has hemorrhoids. Denies rectal bleeding. No relief with otc meds.

## 2017-02-23 NOTE — Discharge Instructions (Signed)
1. Soak in warm baths.  2. Avoid long periods of sitting and sit on donut pillow. 3. Apply anusol and Tucks pads for pain relief. 4. Use colace and miralax to treat constipation and have very soft stools. 5. Follow up with general surgery clinic for ongoing pain.

## 2017-02-23 NOTE — ED Provider Notes (Signed)
[facility]-EMERGENCY DEPT Provider Note   CSN: [id number] Arrival date & time: 02/23/17  1515     History   Chief Complaint Chief Complaint  Patient presents with  . Rectal Pain  . Hemorrhoids    HPI Paul Sherman is a 31 y.o. male.  31 year old male who presents with rectal pain. Yesterday morning the patient began having rectal pain. He reports being constipated leading up to the onset of the pain. His pain is worse when he tries to sit. He denies any rectal bleeding. He has tried over-the-counter preparation H with no relief of his symptoms. He denies any history of problems with hemorrhoids. No recent illness.   The history is provided by the patient.    History reviewed. No pertinent past medical history.  Patient Active Problem List   Diagnosis Date Noted  . Routine general medical examination at a health care facility 03/26/2013    Past Surgical History:  Procedure Laterality Date  . TONSILLECTOMY AND ADENOIDECTOMY  06/22/2012   Procedure: TONSILLECTOMY AND ADENOIDECTOMY;  Surgeon: Melvenia Beam, MD;  Location: [facility] OR;  Service: ENT;  Laterality: Bilateral;       Home Medications    Prior to Admission medications   Medication Sig Start Date End Date Taking? Authorizing Provider  aspirin EC 325 MG tablet Take 975 mg by mouth daily as needed for mild pain.    [provider]  benzonatate (TESSALON) 100 MG capsule Take 1 capsule (100 mg total) by mouth every 8 (eight) hours. 02/26/16   Marlon Pel, PA-C  dicyclomine (BENTYL) 10 MG capsule Take 1 capsule (10 mg total) by mouth 4 (four) times daily -  before meals and at bedtime. 02/26/16   Marlon Pel, PA-C  diphenoxylate-atropine (LOMOTIL) 2.5-0.025 MG tablet Take 1 tablet by mouth 4 (four) times daily as needed for diarrhea or loose stools. 02/26/16   Marlon Pel, PA-C  docusate sodium (COLACE) 100 MG capsule Take 1 capsule (100 mg total) by mouth every 12 (twelve) hours. 02/23/17   Breella Vanostrand, Ambrose Finland, MD  hydrocortisone (ANUSOL-HC) 2.5 % rectal cream Apply rectally 2 times daily as needed for hemorrhoid pain 02/23/17   Fallen Crisostomo, Ambrose Finland, MD  Multiple Vitamin (MULTIVITAMIN WITH MINERALS) TABS tablet Take 1 tablet by mouth daily.    [provider]  Phenyleph-Doxylamine-DM-APAP (NYQUIL SEVERE COLD/FLU PO) Take 2 capsules by mouth daily as needed (cold symptoms).    [provider]  polyethylene glycol powder (GLYCOLAX/MIRALAX) powder Take 17 g by mouth 2 (two) times daily. Until daily soft stools  OTC 02/23/17   Mckenley Birenbaum, Ambrose Finland, MD    Family History Family History  Problem Relation Age of Onset  . Cancer Mother   . Cancer Sister     Social History Social History  Substance Use Topics  . Smoking status: Current Every Day Smoker  . Smokeless tobacco: Not on file  . Alcohol use Yes     Allergies   Patient has no known allergies.   Review of Systems Review of Systems All other systems reviewed and are negative except that which was mentioned in HPI   Physical Exam Updated Vital Signs BP (!) 147/95 (BP Location: Right Arm)   Pulse 98   Temp 97.9 F (36.6 C) (Oral)   Resp 16   SpO2 98%   Physical Exam  Constitutional: He is oriented to person, place, and time. He appears well-developed and well-nourished. No distress.  Laying prone on bed  HENT:  Head: Normocephalic and  atraumatic.  Eyes: Conjunctivae are normal.  Neck: Neck supple.  Genitourinary:  Genitourinary Comments: Large external hemorrhoid, no active bleeding  Neurological: He is alert and oriented to person, place, and time.  Skin: Skin is warm and dry.  Psychiatric: He has a normal mood and affect. Judgment normal.  Nursing note and vitals reviewed. Chaperone was present during exam.    ED Treatments / Results  Labs (all labs ordered are listed, but only abnormal results are displayed) Labs Reviewed - No data to display  EKG  EKG Interpretation None        Radiology No results found.  Procedures Procedures (including critical care time)  Medications Ordered in ED Medications - No data to display   Initial Impression / Assessment and Plan / ED Course  I have reviewed the triage vital signs and the nursing notes.     Pt w/ very large external hemorrhoid on exam. I did not feel comfortable excising due to risk of bleeding and I do not see thrombus. I have given general surgery clinic info and recommended f/u if he does not improve w/ usual hemorrhoid care. Discussed care including MiraLAX and Colace, sitz baths, donut pillow, Anusol, and Tucks pads. Patient voiced understanding and was discharged in satisfactory condition.  Final Clinical Impressions(s) / ED Diagnoses   Final diagnoses:  External hemorrhoids    New Prescriptions Discharge Medication List as of 02/23/2017  6:34 PM    START taking these medications   Details  docusate sodium (COLACE) 100 MG capsule Take 1 capsule (100 mg total) by mouth every 12 (twelve) hours., Starting Sun 02/23/2017, Print    hydrocortisone (ANUSOL-HC) 2.5 % rectal cream Apply rectally 2 times daily as needed for hemorrhoid pain, Print    polyethylene glycol powder (GLYCOLAX/MIRALAX) powder Take 17 g by mouth 2 (two) times daily. Until daily soft stools  OTC, Starting Sun 02/23/2017, Print         Jamieka Royle, Ambrose Finland, MD 02/23/17 [phone]

## 2017-02-26 ENCOUNTER — Emergency Department (HOSPITAL_COMMUNITY)
Admission: EM | Admit: 2017-02-26 | Discharge: 2017-02-26 | Disposition: A | Discharge status: Home / Self Care | Payer: Self-pay | Attending: Physician Assistant | Admitting: Physician Assistant

## 2017-02-26 ENCOUNTER — Encounter (HOSPITAL_COMMUNITY): Payer: Self-pay | Admitting: *Deleted

## 2017-02-26 DIAGNOSIS — K649 Unspecified hemorrhoids: Secondary | ICD-10-CM | POA: Insufficient documentation

## 2017-02-26 DIAGNOSIS — Z79899 Other long term (current) drug therapy: Secondary | ICD-10-CM | POA: Insufficient documentation

## 2017-02-26 DIAGNOSIS — F17200 Nicotine dependence, unspecified, uncomplicated: Secondary | ICD-10-CM | POA: Insufficient documentation

## 2017-02-26 DIAGNOSIS — K644 Residual hemorrhoidal skin tags: Secondary | ICD-10-CM

## 2017-02-26 NOTE — ED Triage Notes (Signed)
Pt seen here on Sunday for hemorrhoids and then came back to have recheck of it and get a longer note to be out of work

## 2017-02-26 NOTE — ED Provider Notes (Signed)
[facility]-EMERGENCY DEPT Provider Note   CSN: [id number] Arrival date & time: 02/26/17  1549     History   Chief Complaint Chief Complaint  Patient presents with  . Hemorrhoids    HPI Paul Sherman is a 31 y.o. male who presents to the emergency department with a chief complaint of recheck of external hemorrhoids. The patient reports that he was seen 3 days ago and discharged to home with Miralax, Tucks pads, and Anusol. The patient reports he has been compliant with the treatment and states that he feels that his hemorrhoid is improving. He reports that he has been using MiraLAX 3 times a day, but states that feels as if he still had to strain to have a bowel movement. 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 He denies rectal bleeding. No h/o of similar symptoms. No recent illness.   The history is provided by the patient. No language interpreter was used.    History reviewed. No pertinent past medical history.  Patient Active Problem List   Diagnosis Date Noted  . Routine general medical examination at a health care facility 03/26/2013    Past Surgical History:  Procedure Laterality Date  . TONSILLECTOMY AND ADENOIDECTOMY  06/22/2012   Procedure: TONSILLECTOMY AND ADENOIDECTOMY;  Surgeon: Melvenia Beam, MD;  Location: [facility] OR;  Service: ENT;  Laterality: Bilateral;       Home Medications    Prior to Admission medications   Medication Sig Start Date End Date Taking? Authorizing Provider  aspirin EC 325 MG tablet Take 975 mg by mouth daily as needed for mild pain.    [provider]  benzonatate (TESSALON) 100 MG capsule Take 1 capsule (100 mg total) by mouth every 8 (eight) hours. 02/26/16   Marlon Pel, PA-C  dicyclomine (BENTYL) 10 MG capsule Take 1 capsule (10 mg total) by mouth 4 (four) times daily  -  before meals and at bedtime. 02/26/16   Marlon Pel, PA-C  diphenoxylate-atropine (LOMOTIL) 2.5-0.025 MG tablet Take 1 tablet by mouth 4 (four) times daily as needed for diarrhea or loose stools. 02/26/16   Marlon Pel, PA-C  docusate sodium (COLACE) 100 MG capsule Take 1 capsule (100 mg total) by mouth every 12 (twelve) hours. 02/23/17   Little, Ambrose Finland, MD  hydrocortisone (ANUSOL-HC) 2.5 % rectal cream Apply rectally 2 times daily as needed for hemorrhoid pain 02/23/17   Little, Ambrose Finland, MD  Multiple Vitamin (MULTIVITAMIN WITH MINERALS) TABS tablet Take 1 tablet by mouth daily.    [provider]  Phenyleph-Doxylamine-DM-APAP (NYQUIL SEVERE COLD/FLU PO) Take 2 capsules by mouth daily as needed (cold symptoms).    [provider]  polyethylene glycol powder (GLYCOLAX/MIRALAX) powder Take 17 g by mouth 2 (two) times daily. Until daily soft stools  OTC 02/23/17   Little, Ambrose Finland, MD    Family History Family History  Problem Relation Age of Onset  . Cancer Mother   . Cancer Sister     Social History Social History  Substance Use Topics  . Smoking status: Current Every Day Smoker  . Smokeless tobacco: Never Used  . Alcohol use Yes     Allergies   Patient has no known allergies.   Review of Systems Review of Systems  Constitutional: Negative for activity change.  Respiratory: Negative for shortness of  breath.   Cardiovascular: Negative for chest pain.  Gastrointestinal: Positive for constipation. Negative for abdominal pain, anal bleeding and blood in stool.       External hemorrhoid  Musculoskeletal: Negative for back pain.  Skin: Negative for rash.     Physical Exam Updated Vital Signs BP 136/89 (BP Location: Left Arm)   Pulse 69   Temp 98.3 F (36.8 C) (Oral)   Resp 20   SpO2 99%   Physical Exam  Constitutional: He appears well-developed.  HENT:  Head: Normocephalic.  Eyes: Conjunctivae are normal.  Neck: Neck supple.    Cardiovascular: Normal rate and regular rhythm.   No murmur heard. Pulmonary/Chest: Effort normal.  Abdominal: Soft. He exhibits no distension.  Genitourinary:  Genitourinary Comments: Large external hemorrhoid. No evidence of thrombosis. No evidence of rectal prolapse.  Neurological: He is alert.  Skin: Skin is warm and dry.  Psychiatric: His behavior is normal.  Nursing note and vitals reviewed.    ED Treatments / Results  Labs (all labs ordered are listed, but only abnormal results are displayed) Labs Reviewed - No data to display  EKG  EKG Interpretation None       Radiology No results found.  Procedures Procedures (including critical care time)  Medications Ordered in ED Medications - No data to display   Initial Impression / Assessment and Plan / ED Course  I have reviewed the triage vital signs and the nursing notes.  Pertinent labs & imaging results that were available during my care of the patient were reviewed by me and considered in my medical decision making (see chart for details).     31 year old male presenting for a recheck of an external hemorrhoid. He reports his symptoms have improved since he was seen 3 days ago. He has been using Tucks pads, Miralax, and preparation H. he states that he still feels as if he straining when he has a bowel movement. Recommended Metamucil, increasing the fiber in his diet, and staying hydrated. Provided the patient with a work note. Vital signs stable. No acute distress. The patient is safe for discharge at this time.  Final Clinical Impressions(s) / ED Diagnoses   Final diagnoses:  External hemorrhoids    New Prescriptions New Prescriptions   No medications on file     Barkley Boards, PA-C 02/26/17 1746    McDonald, Mia A, PA-C 02/26/17 1746    Abelino Derrick, MD 03/02/17 [phone]

## 2017-02-26 NOTE — Discharge Instructions (Signed)
Please continue to use MiraLAX as directed. You can also use Metamucil, which is a fiber products, which can help bulk up your stool. Please continue to stay hydrated and drink plenty of water.  If your symptoms significant worsening, please return to the emergency department.

## 2018-08-21 IMAGING — CR DG CHEST 2V
2 series · 2 of 2 positions shown · non-contrast
Comparison: None.

CLINICAL DATA: Nausea, vomiting, diarrhea, fever

EXAM:
CHEST  2 VIEW

[w chest pa]
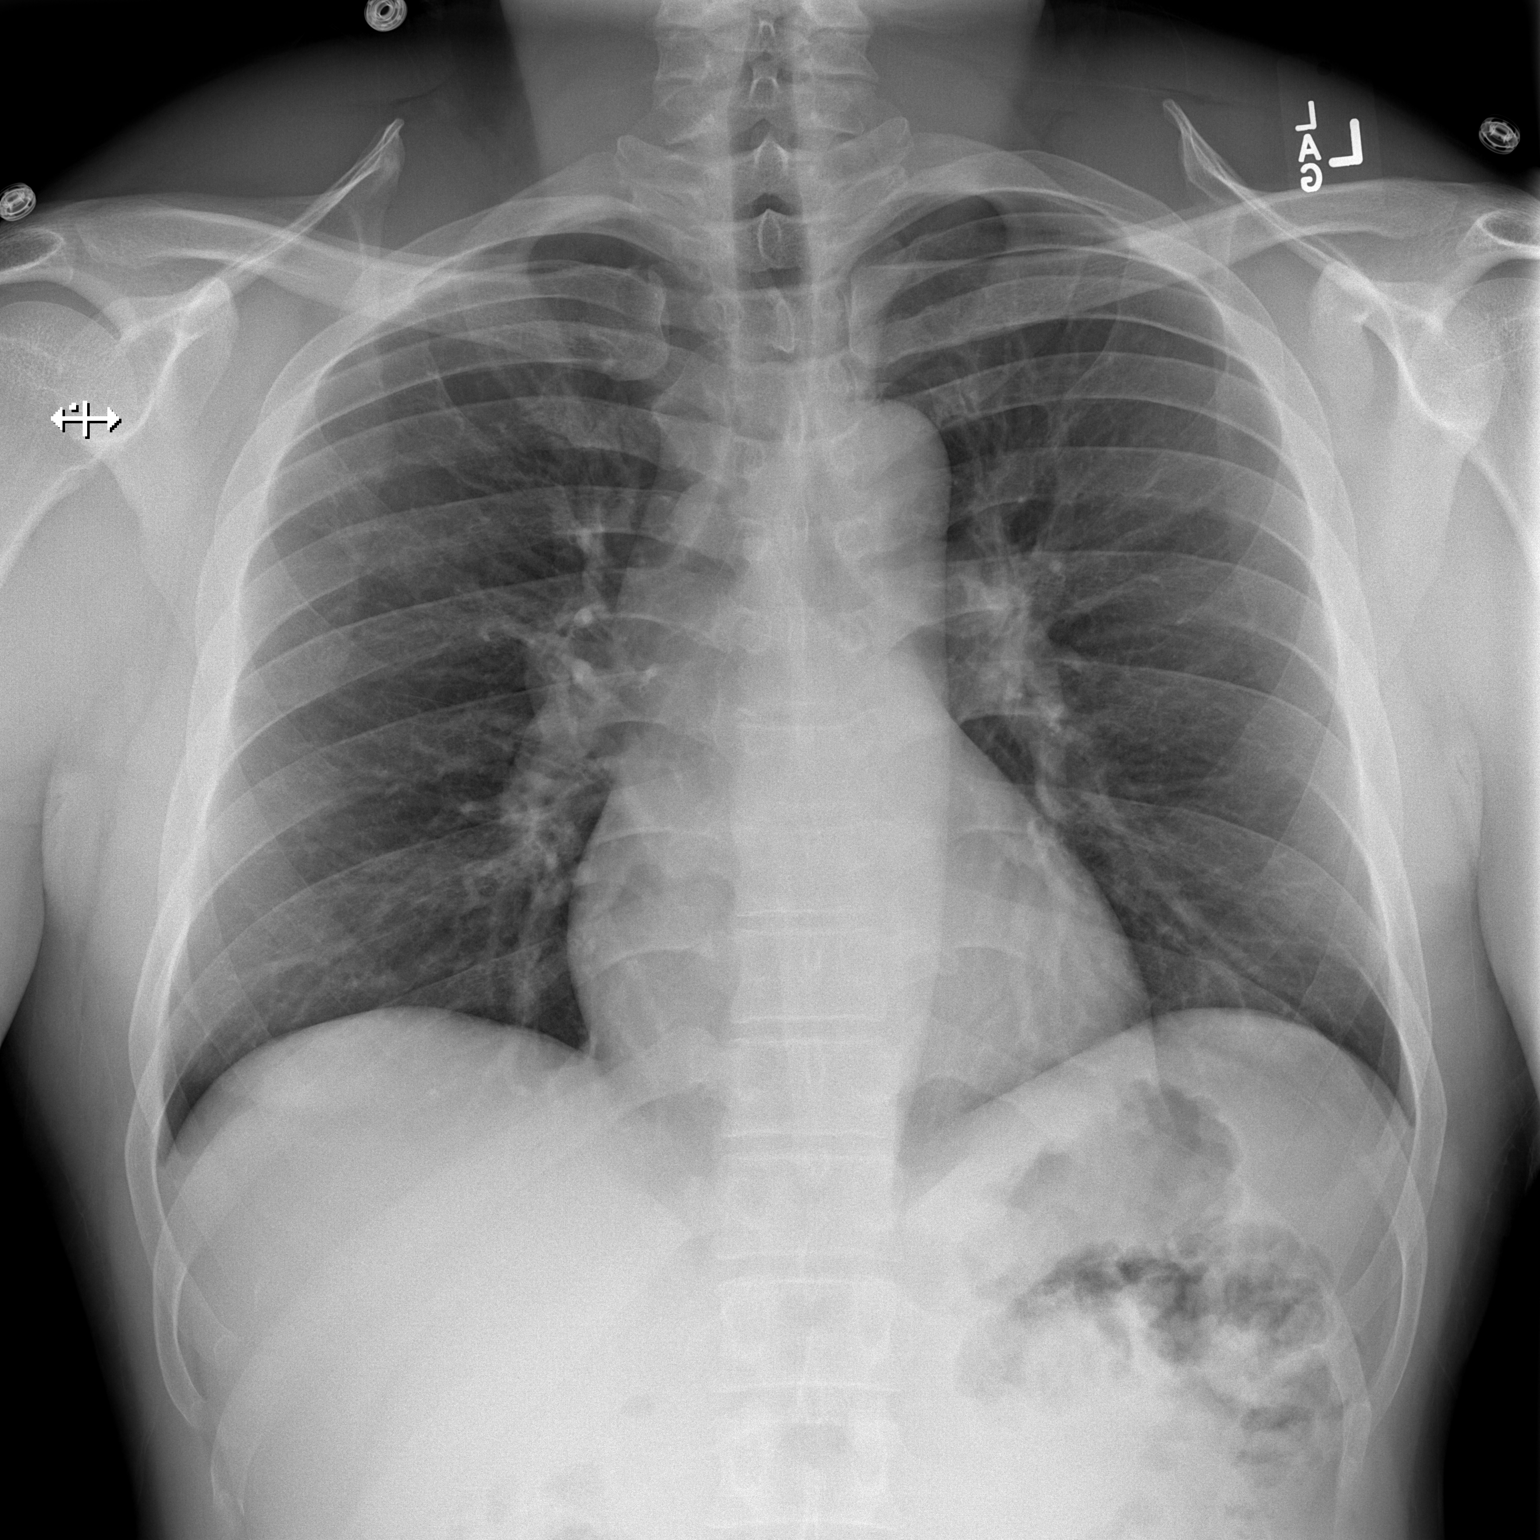

[w chest lat]
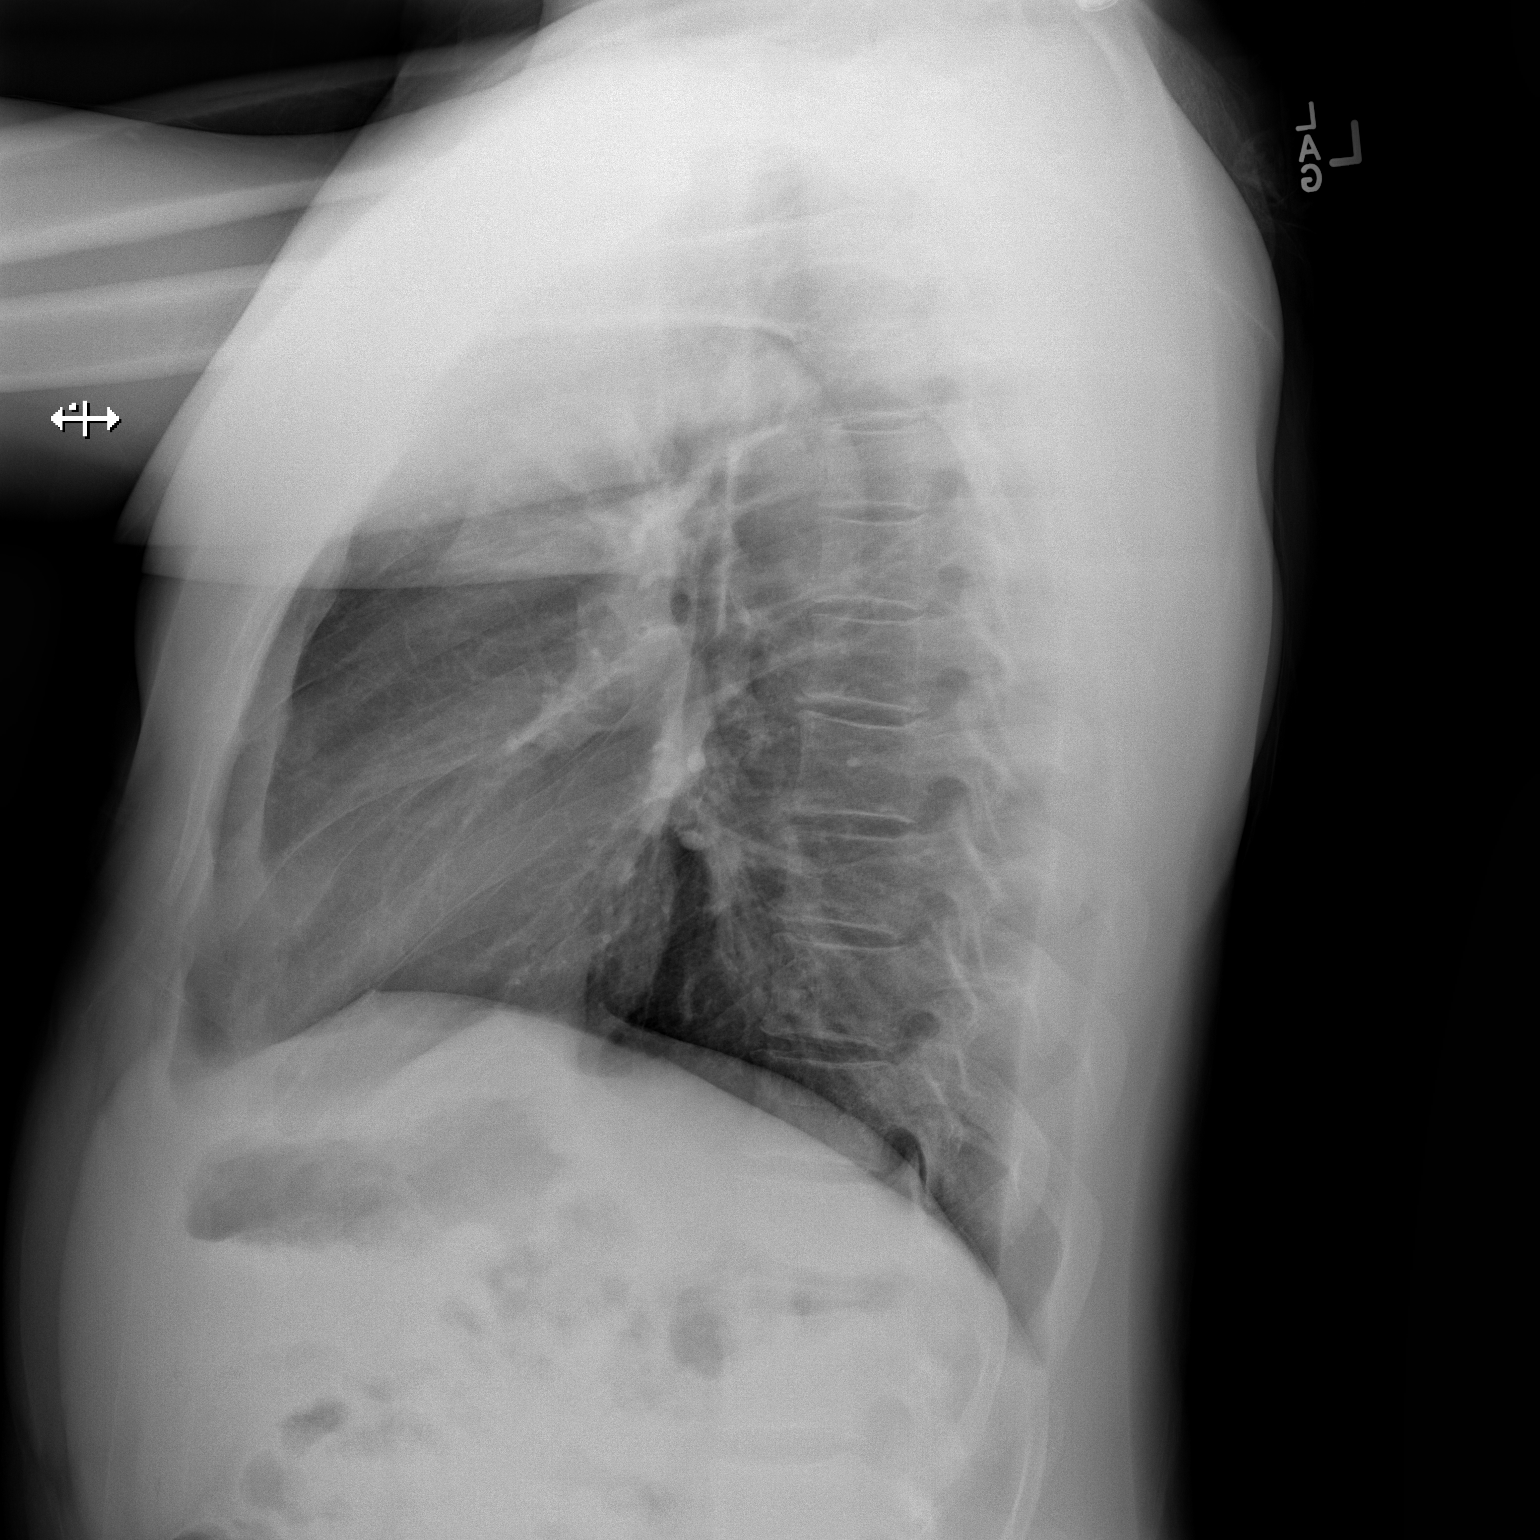

[2 of 2 positions shown; findings below may reference images not displayed]

FINDINGS: The heart size and mediastinal contours are within normal limits.
Both lungs are clear. The visualized skeletal structures are
unremarkable.
IMPRESSION: No active cardiopulmonary disease.
